# Patient Record
Sex: Male | Born: 1985 | Race: White | Hispanic: No | Marital: Married | Smoking: Never smoker
Health system: Southern US, Community
[De-identification: ages and names within clinical notes are randomized; demographics above are authoritative.]

## PROBLEM LIST (undated history)

## (undated) DIAGNOSIS — F419 Anxiety disorder, unspecified: Secondary | ICD-10-CM

## (undated) DIAGNOSIS — B019 Varicella without complication: Secondary | ICD-10-CM

## (undated) DIAGNOSIS — Z800 Family history of malignant neoplasm of digestive organs: Secondary | ICD-10-CM

## (undated) DIAGNOSIS — L409 Psoriasis, unspecified: Secondary | ICD-10-CM

## (undated) DIAGNOSIS — Z803 Family history of malignant neoplasm of breast: Secondary | ICD-10-CM

## (undated) DIAGNOSIS — G4700 Insomnia, unspecified: Secondary | ICD-10-CM

## (undated) DIAGNOSIS — Z801 Family history of malignant neoplasm of trachea, bronchus and lung: Secondary | ICD-10-CM

## (undated) DIAGNOSIS — M549 Dorsalgia, unspecified: Secondary | ICD-10-CM

## (undated) DIAGNOSIS — K589 Irritable bowel syndrome without diarrhea: Secondary | ICD-10-CM

## (undated) DIAGNOSIS — R011 Cardiac murmur, unspecified: Secondary | ICD-10-CM

## (undated) DIAGNOSIS — M1990 Unspecified osteoarthritis, unspecified site: Secondary | ICD-10-CM

## (undated) HISTORY — DX: Family history of malignant neoplasm of trachea, bronchus and lung: Z80.1

## (undated) HISTORY — DX: Psoriasis, unspecified: L40.9

## (undated) HISTORY — PX: COLONOSCOPY: SHX174

## (undated) HISTORY — DX: Insomnia, unspecified: G47.00

## (undated) HISTORY — DX: Family history of malignant neoplasm of digestive organs: Z80.0

## (undated) HISTORY — DX: Dorsalgia, unspecified: M54.9

## (undated) HISTORY — DX: Anxiety disorder, unspecified: F41.9

## (undated) HISTORY — DX: Family history of malignant neoplasm of breast: Z80.3

## (undated) HISTORY — PX: WISDOM TOOTH EXTRACTION: SHX21

## (undated) HISTORY — DX: Unspecified osteoarthritis, unspecified site: M19.90

## (undated) HISTORY — DX: Varicella without complication: B01.9

## (undated) HISTORY — DX: Cardiac murmur, unspecified: R01.1

## (undated) HISTORY — DX: Irritable bowel syndrome without diarrhea: K58.9

---

## 2015-05-04 ENCOUNTER — Ambulatory Visit (INDEPENDENT_AMBULATORY_CARE_PROVIDER_SITE_OTHER): Payer: BLUE CROSS/BLUE SHIELD | Admitting: Family Medicine

## 2015-05-04 ENCOUNTER — Encounter: Payer: Self-pay | Admitting: Family Medicine

## 2015-05-04 VITALS — BP 128/72 | HR 72 | Temp 98.0°F | Resp 16 | Ht 75.0 in | Wt 204.0 lb

## 2015-05-04 DIAGNOSIS — L409 Psoriasis, unspecified: Secondary | ICD-10-CM

## 2015-05-04 DIAGNOSIS — Z0000 Encounter for general adult medical examination without abnormal findings: Secondary | ICD-10-CM

## 2015-05-04 DIAGNOSIS — M1712 Unilateral primary osteoarthritis, left knee: Secondary | ICD-10-CM

## 2015-05-04 DIAGNOSIS — M129 Arthropathy, unspecified: Secondary | ICD-10-CM

## 2015-05-04 DIAGNOSIS — R222 Localized swelling, mass and lump, trunk: Secondary | ICD-10-CM

## 2015-05-04 DIAGNOSIS — Z114 Encounter for screening for human immunodeficiency virus [HIV]: Secondary | ICD-10-CM

## 2015-05-04 DIAGNOSIS — R229 Localized swelling, mass and lump, unspecified: Secondary | ICD-10-CM | POA: Diagnosis not present

## 2015-05-04 DIAGNOSIS — B353 Tinea pedis: Secondary | ICD-10-CM | POA: Diagnosis not present

## 2015-05-04 MED ORDER — CLOTRIMAZOLE 1 % EX CREA: 1.0000 "application " | TOPICAL_CREAM | Freq: Two times a day (BID) | CUTANEOUS | Status: DC

## 2015-05-04 MED ORDER — TRIAMCINOLONE ACETONIDE 0.1 % EX CREA: 1.0000 "application " | TOPICAL_CREAM | Freq: Two times a day (BID) | CUTANEOUS | Status: DC

## 2015-05-04 NOTE — Progress Notes (Addendum)
Subjective:    Patient ID: Paul [name], male    DOB: July 25, 1986, 29 y.o.   MRN: [id number]  HPI Patient presents for establishment of care  Arthritis left knee: Patient has a history of left knee trauma, and was told by orthopedics prior that he had moderate arthritis in that knee. He did not have to undergo knee surgery. Original trauma was when he was early 69s. He reports increased pain now that he is trying to jog daily, outside. He does not take any over-the-counter medications. He does not wear a knee brace or sleeve. He denies any erythema or swelling of the knee. He does not know of any family history of arthritis.  Scalp lesion: Patient reports he has noticed over the last 2 years he frequently has a scalp lesion, either over his left ear or near the front of his frontal hairline, that becomes scaly. Patient states he noticed a similar patch on his left upper arm, that eventually resolved on its own. Patient has not tried any over-the-counter medications or creams. He has tried medicated shampoos. He denies any erythema, drainage or pain associated with the lesion.  Bilateral feet blisters: Patient complains of occasional bilateral feet blisters, that are located on the heels and arch of his feet, that appear to occur more after running or working out. Patient describes blisterlike formations, that he pops and they feel better. He states he's had athletes foot in the past, but this looks different than what he is used to.  Mass on back: Patient has had a mass on his back for multiple years, without growth or tenderness. Patient states he's never had it examined, but his fiance has also noticed it and has concerns. There is no redness, swelling, pain or drainage associated with the mass.   Health maintenance:  Colonoscopy: No family history of colon cancer, routine screening at age 29 PSA: No family history of prostate cancer, routine screening  Immunizations: Tetanus up-to-date, flu  vaccination due Infectious disease screening: HIV screen needed   Non smoker Past Medical History  Diagnosis Date  . Arthritis   . Heart murmur     present since childhood, normal echo (per pt)  No Known Allergies  History reviewed. No pertinent past surgical history. Family History  Problem Relation Age of Onset  . Alcohol abuse Mother   . Lung cancer Father 95    nonsmoker  . Breast cancer Maternal Grandmother 61   Social History   Social History  . Marital Status: Single    Spouse Name: N/A  . Number of Children: N/A  . Years of Education: N/A   Occupational History  . Not on file.   Social History Main Topics  . Smoking status: Never Smoker   . Smokeless tobacco: Never Used  . Alcohol Use: Yes     Comment: 3x a week.   . Drug Use: No  . Sexual Activity: Yes    Birth Control/ Protection: None   Other Topics Concern  . Not on file   Social History Narrative   Recently moved from [place]. Engaged to [employer]. Wedding Date 01/2016. 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 1 dog. Wears his seatbelt. Wears a bike helmet. Exercises more than 3 times a week. Has a smoke alarm the home. Has firearms in the home.     Review of Systems  Constitutional: Negative.  Negative for activity change, appetite change, fatigue and unexpected weight change.  HENT: Negative.  Negative for congestion, hearing loss, sinus pressure, sneezing, tinnitus, trouble swallowing and voice change.   Eyes: Negative.  Negative for pain and visual disturbance.  Respiratory: Negative.  Negative for shortness of breath and wheezing.   Cardiovascular: Negative.  Negative for chest pain, palpitations and leg swelling.  Gastrointestinal: Negative.  Negative for nausea, abdominal pain, diarrhea, constipation and abdominal distention.  Endocrine: Negative.  Negative for polyuria.  Genitourinary: Negative.  Negative for urgency, hematuria, decreased urine volume and difficulty  urinating.  Musculoskeletal: Positive for arthralgias. Negative for myalgias, back pain, joint swelling, gait problem and neck pain.  Skin: Positive for rash.  Allergic/Immunologic: Negative.   Neurological: Negative for dizziness, tremors, syncope, weakness, numbness and headaches.  Hematological: Negative.   Psychiatric/Behavioral: Negative for sleep disturbance, decreased concentration and agitation. The patient is not nervous/anxious.        Objective:   Physical Exam BP 128/72 mmHg  Pulse 72  Temp(Src) 98 F (36.7 C) (Oral)  Resp 16  Ht  (1.905 m)  Wt 204 lb (92.534 kg)  BMI 25.50 kg/m2  SpO2 97% Gen: Afebrile. No acute distress. Nontoxic in appearance, well-developed, well-nourished, physically fit, athletic male. HENT: AT. [place]. Bilateral TM visualized and normal in appearance. MMM. Bilateral nares without erythema or swelling. Throat without erythema or exudates.  Eyes:Pupils Equal Round Reactive to light, Extraocular movements intact, Conjunctiva without redness, discharge or icterus. Neck: Supple, no lymphadenopathy, no thyromegaly CV: RRR 1/6 systolic murmur present, no edema, +2/4 P posterior tibialis pulses Chest: CTAB, no wheeze or crackles Abd: Soft. Flat. NTND. BS present. No Masses palpated. No hepatosplenomegaly Skin: No purpura or petechiae. Approximately 1.5 inch scaly patch lesion posterior auricular scalp. Left posterior scapula soft tissue mass, soft, nontender, no erythema, no drainage. Mobile. Neuro: Normal gait. PERLA. EOMi. Alert. Oriented. Cranial nerves II through XII intact. Muscle strength 5/5 upper and lower extremity. DTRs equal bilaterally. Psych: Normal affect, dress and demeanor. Normal speech. Normal thought content and judgment.    Assessment & Plan:  [name] is a 29 y.o. Caucasian male establishing care. Left knee arthritis: Patient with a history of left knee arthritis, his recently noticed acute changes with jogging. Discussed with  patient he may need to obtain a knee sleeve from one of the sports/medical supply companies. Advised him that if he wants to continue running he should at least wear one of these. Encouraged him to not overuse his left knee joint, with his history of arthritis after trauma, he may need to perform exercises with less impact. Even running indoors on a treadmill, would ease some pressure on his joints. New shoes that are well supported. Consider lower impact sports.   Mass on back: Patient with lipoma-like mass below left scapula. Mobile, nontender soft. Discussed we would watch and wait, if it becomes tender, starts to grow, or becomes more concerning, then we could send him to surgery for evaluation, and possible removal. Patient agrees with plan, and which is like to wait at this time.  Psoriasis of scalp: Psoriasis-like patches on scalp. Discussed with patient trial of Kenalog cream. He may eventually need to try foam since it is in his hairline. We'll start with cream first, and gauge response. - Patient will follow-up as needed.  Tinea pedis: Patient with blisterlike formations that occur intermittently on the bottom of his feet after working out. Right foot with scaly, blisterlike formation. Likely fungal considering history and nature. Prescribed Lotrimin anti-fungal, patient to follow-up if no improvement. Discussed that if  it improves with his, that would suggest it is fungal. Discussed keeping area clean and dry, wearing absorbent socks.   Health maintenance:  Colonoscopy: No family history of colon cancer, routine screening at age 56 PSA: No family history of prostate cancer, routine screening  Immunizations: Tetanus up-to-date, flu vaccination due patient is to make a nurse visit to have completed within the next 2 weeks, if he does not obtain at his place of employment. Infectious disease screening: HIV screen future ordered. CBC also ordered.   > 25 minutes spent with patient, >50% of  time spent face to face counseling patient and coordinating care.

## 2015-05-04 NOTE — Patient Instructions (Signed)

## 2015-05-04 NOTE — Addendum Note (Signed)
Addended by: Felix Pacini A on: 05/04/2015 06:25 PM   Modules accepted: Orders

## 2015-05-04 NOTE — Addendum Note (Signed)
Addended by: Felix Pacini A on: 05/04/2015 06:24 PM   Modules accepted: Orders

## 2015-05-04 NOTE — Progress Notes (Signed)
Pre visit review using our clinic review tool, if applicable. No additional management support is needed unless otherwise documented below in the visit note. 

## 2015-05-25 ENCOUNTER — Encounter: Payer: Self-pay | Admitting: Family Medicine

## 2015-07-05 ENCOUNTER — Ambulatory Visit (INDEPENDENT_AMBULATORY_CARE_PROVIDER_SITE_OTHER): Payer: BLUE CROSS/BLUE SHIELD | Admitting: Family Medicine

## 2015-07-05 ENCOUNTER — Encounter: Payer: Self-pay | Admitting: Family Medicine

## 2015-07-05 VITALS — BP 128/71 | HR 68 | Temp 98.4°F | Resp 18 | Wt 205.8 lb

## 2015-07-05 DIAGNOSIS — F411 Generalized anxiety disorder: Secondary | ICD-10-CM | POA: Diagnosis not present

## 2015-07-05 MED ORDER — LORAZEPAM 0.5 MG PO TABS: 0.5000 mg | ORAL_TABLET | Freq: Two times a day (BID) | ORAL | Status: DC | PRN

## 2015-07-05 MED ORDER — ESCITALOPRAM OXALATE 10 MG PO TABS: 10.0000 mg | ORAL_TABLET | Freq: Every day | ORAL | Status: DC

## 2015-07-05 NOTE — Progress Notes (Signed)
Subjective:    Patient ID: Paul [name], male    DOB: 1985/09/18, 29 y.o.   MRN: [id number]  HPI  Elevated blood pressure/anxiety related: Patient presents for office visit today after having 2 elevated blood pressure readings at health fair at work. Patient states that he had his blood pressure taking in the morning and in the afternoon at work on 2 different days if his blood pressure was high on both occasions. He states the highest blood pressure reading is high was 158/86. His blood pressure is normal today in the office, and consistent with prior blood pressure readings taken in this office. Patient reports that he has been more stressed. 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 He states managing people is causing him to be more stressed in the work environment. He does feel like he is taking his work home, and he is been unable to fall asleep at night. He states that he doesn't feel like he had a good night sleep in quite some time. He admits to being stressed on the weekends, especially Sunday when he realizes he has to return to work on Monday.  Past Medical History  Diagnosis Date  . Arthritis   . Heart murmur     present since childhood, normal echo 2002  . Back pain     03/17/12 xray: minimal compression deformity L1. 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  . Tinea versicolor   . Chickenpox     childhood    No Known Allergies Social History   Social History  . Marital Status: Single    Spouse Name: N/A  . Number of Children: N/A  . Years of Education: N/A   Occupational History  . Not on file.   Social History Main Topics  . Smoking status: Never Smoker   . Smokeless tobacco: Never Used  . Alcohol Use: Yes     Comment: 3x a week.   . Drug Use: No  . Sexual Activity: Yes    Birth Control/ Protection: None   Other Topics Concern  . Not on file   Social History Narrative   Recently moved from [place].  Engaged to [employer]. Wedding Date 01/2016. 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 1 dog. Wears his seatbelt. Wears a bike helmet. Exercises more than 3 times a week. Has a smoke alarm the home. Has firearms in the home.     Review of Systems Negative, with the exception of above mentioned in HPI     Objective:   Physical Exam BP 128/71 mmHg  Pulse 68  Temp(Src) 98.4 F (36.9 C) (Oral)  Resp 18  Wt 205 lb 12 oz (93.328 kg)  SpO2 99% Gen: Afebrile. No acute distress. Nontoxic in appearance, well-developed, well-nourished Caucasian male. HENT: AT. [place].  MMM.  Eyes:Pupils Equal Round Reactive to light, Extraocular movements intact,  Conjunctiva without redness, discharge or icterus. CV: RRR Psych: Anxious, psychomotor agitation present, Normal dress. Normal speech. Normal thought content and judgment.Marland Kitchen    GAD 7 : Generalized Anxiety Score 07/05/2015  Nervous, Anxious, on Edge 2  Control/stop worrying 2  Worry too much - different things 2  Trouble relaxing 3  Restless 3  Easily annoyed or irritable 2  Afraid - awful might happen 0  Total GAD 7 Score 14  Anxiety Difficulty Somewhat difficult       Assessment & Plan:  1. Anxiety state - New-onset  anxiety, seems to be surrounding his work environment and new job responsibilities. GAD 14. - LORazepam (ATIVAN) 0.5 MG tablet; Take 1 tablet (0.5 mg total) by mouth 2 (two) times daily as needed for anxiety.  Dispense: 60 tablet; Refill: 1 - escitalopram (LEXAPRO) 10 MG tablet; Take 1 tablet (10 mg total) by mouth daily.  Dispense: 30 tablet; Refill: 0 - Discussed short-term use of Ativan, and use prior to bed. Start Lexapro 10 mg daily, follow-up in 4 weeks. At that time we will discuss if patient needs higher dose. If doing well we'll follow-up every 3 months.

## 2015-07-05 NOTE — Patient Instructions (Signed)
Generalized Anxiety Disorder Generalized anxiety disorder (GAD) is a mental disorder. It interferes with life functions, including relationships, work, and school. GAD is different from normal anxiety, which everyone experiences at some point in their lives in response to specific life events and activities. Normal anxiety actually helps us prepare for and get through these life events and activities. Normal anxiety goes away after the event or activity is over.  GAD causes anxiety that is not necessarily related to specific events or activities. It also causes excess anxiety in proportion to specific events or activities. The anxiety associated with GAD is also difficult to control. GAD can vary from mild to severe. People with severe GAD can have intense waves of anxiety with physical symptoms (panic attacks).  SYMPTOMS The anxiety and worry associated with GAD are difficult to control. This anxiety and worry are related to many life events and activities and also occur more days than not for 6 months or longer. People with GAD also have three or more of the following symptoms (one or more in children):  Restlessness.   Fatigue.  Difficulty concentrating.   Irritability.  Muscle tension.  Difficulty sleeping or unsatisfying sleep. DIAGNOSIS GAD is diagnosed through an assessment by your health care provider. Your health care provider will ask you questions aboutyour mood,physical symptoms, and events in your life. Your health care provider may ask you about your medical history and use of alcohol or drugs, including prescription medicines. Your health care provider may also do a physical exam and blood tests. Certain medical conditions and the use of certain substances can cause symptoms similar to those associated with GAD. Your health care provider may refer you to a mental health specialist for further evaluation. TREATMENT The following therapies are usually used to treat GAD:    Medication. Antidepressant medication usually is prescribed for long-term daily control. Antianxiety medicines may be added in severe cases, especially when panic attacks occur.   Talk therapy (psychotherapy). Certain types of talk therapy can be helpful in treating GAD by providing support, education, and guidance. A form of talk therapy called cognitive behavioral therapy can teach you healthy ways to think about and react to daily life events and activities.  Stress managementtechniques. These include yoga, meditation, and exercise and can be very helpful when they are practiced regularly. A mental health specialist can help determine which treatment is best for you. Some people see improvement with one therapy. However, other people require a combination of therapies.   This information is not intended to replace advice given to you by your health care provider. Make sure you discuss any questions you have with your health care provider.   Document Released: 12/23/2012 Document Revised: 09/18/2014 Document Reviewed: 12/23/2012 Elsevier Interactive Patient Education [employer]2016 Elsevier Inc.  Your anxiety is likely situational, surrounding work/life changes.   I will need to see you in 4 weeks to discuss meds and increase dose if needed at that time.

## 2015-07-09 ENCOUNTER — Encounter: Payer: Self-pay | Admitting: *Deleted

## 2015-08-02 ENCOUNTER — Ambulatory Visit (INDEPENDENT_AMBULATORY_CARE_PROVIDER_SITE_OTHER): Payer: BLUE CROSS/BLUE SHIELD | Admitting: Family Medicine

## 2015-08-02 ENCOUNTER — Encounter: Payer: Self-pay | Admitting: Family Medicine

## 2015-08-02 VITALS — BP 127/74 | HR 81 | Temp 98.6°F | Resp 20 | Wt 206.0 lb

## 2015-08-02 DIAGNOSIS — Z8719 Personal history of other diseases of the digestive system: Secondary | ICD-10-CM | POA: Insufficient documentation

## 2015-08-02 DIAGNOSIS — F411 Generalized anxiety disorder: Secondary | ICD-10-CM

## 2015-08-02 DIAGNOSIS — G4700 Insomnia, unspecified: Secondary | ICD-10-CM | POA: Insufficient documentation

## 2015-08-02 DIAGNOSIS — M549 Dorsalgia, unspecified: Secondary | ICD-10-CM | POA: Insufficient documentation

## 2015-08-02 DIAGNOSIS — M546 Pain in thoracic spine: Secondary | ICD-10-CM

## 2015-08-02 MED ORDER — TRAZODONE HCL 50 MG PO TABS: 25.0000 mg | ORAL_TABLET | Freq: Every evening | ORAL | Status: DC | PRN

## 2015-08-02 MED ORDER — HYDROCORTISONE ACETATE 25 MG RE SUPP: 25.0000 mg | Freq: Two times a day (BID) | RECTAL | Status: DC

## 2015-08-02 MED ORDER — ESCITALOPRAM OXALATE 20 MG PO TABS: 20.0000 mg | ORAL_TABLET | Freq: Every day | ORAL | Status: DC

## 2015-08-02 NOTE — Patient Instructions (Signed)
High-Fiber Diet Fiber, also called dietary fiber, is a type of carbohydrate found in fruits, vegetables, whole grains, and beans. A high-fiber diet can have many health benefits. Your health care provider may recommend a high-fiber diet to help:  Prevent constipation. Fiber can make your bowel movements more regular.  Lower your cholesterol.  Relieve hemorrhoids, uncomplicated diverticulosis, or irritable bowel syndrome.  Prevent overeating as part of a weight-loss plan.  Prevent heart disease, type 2 diabetes, and certain cancers. WHAT IS MY PLAN? The recommended daily intake of fiber includes:  38 grams for men under age 27.  97 grams for men over age 77.  70 grams for women under age 31.  7 grams for women over age 62. You can get the recommended daily intake of dietary fiber by eating a variety of fruits, vegetables, grains, and beans. Your health care provider may also recommend a fiber supplement if it is not possible to get enough fiber through your diet. WHAT DO I NEED TO KNOW ABOUT A HIGH-FIBER DIET?  Fiber supplements have not been widely studied for their effectiveness, so it is better to get fiber through food sources.  Always check the fiber content on thenutrition facts label of any prepackaged food. Look for foods that contain at least 5 grams of fiber per serving.  Ask your dietitian if you have questions about specific foods that are related to your condition, especially if those foods are not listed in the following section.  Increase your daily fiber consumption gradually. Increasing your intake of dietary fiber too quickly may cause bloating, cramping, or gas.  Drink plenty of water. Water helps you to digest fiber. WHAT FOODS CAN I EAT? Grains Whole-grain breads. Multigrain cereal. Oats and oatmeal. Brown rice. Barley. Bulgur wheat. [place]. Bran muffins. Popcorn. Rye wafer crackers. Vegetables Sweet potatoes. Spinach. Kale. Artichokes. Cabbage. Broccoli.  Green peas. Carrots. Squash. Fruits Berries. Pears. Apples. Oranges. Avocados. Prunes and raisins. Dried figs. Meats and Other Protein Sources Navy, kidney, pinto, and soy beans. Split peas. Lentils. Nuts and seeds. Dairy Fiber-fortified yogurt. Beverages Fiber-fortified soy milk. Fiber-fortified orange juice. Other Fiber bars. The items listed above may not be a complete list of recommended foods or beverages. Contact your dietitian for more options. WHAT FOODS ARE NOT RECOMMENDED? Grains White bread. Pasta made with refined flour. White rice. Vegetables Fried potatoes. Canned vegetables. Well-cooked vegetables.  Fruits Fruit juice. Cooked, strained fruit. Meats and Other Protein Sources Fatty cuts of meat. Fried Sales executive or fried fish. Dairy Milk. Yogurt. Cream cheese. Sour cream. Beverages Soft drinks. Other Cakes and pastries. Butter and oils. The items listed above may not be a complete list of foods and beverages to avoid. Contact your dietitian for more information. WHAT ARE SOME TIPS FOR INCLUDING HIGH-FIBER FOODS IN MY DIET?  Eat a wide variety of high-fiber foods.  Make sure that half of all grains consumed each day are whole grains.  Replace breads and cereals made from refined flour or white flour with whole-grain breads and cereals.  Replace white rice with brown rice, bulgur wheat, or millet.  Start the day with a breakfast that is high in fiber, such as a cereal that contains at least 5 grams of fiber per serving.  Use beans in place of meat in soups, salads, or pasta.  Eat high-fiber snacks, such as berries, raw vegetables, nuts, or popcorn.   This information is not intended to replace advice given to you by your health care provider. Make sure you discuss  any questions you have with your health care provider.   Document Released: 08/28/2005 Document Revised: 09/18/2014 Document Reviewed: 02/10/2014 Elsevier Interactive Patient Education 2016 Elsevier  Inc.    35 grams goal. Trazodone 1-2 tabs prior to bed. Try not to use ativan with trazodone if able. Take about 1 hour before bed.  I have increased your dose of lexapro to 20 mg daily.

## 2015-08-02 NOTE — Progress Notes (Signed)
Subjective:    Patient ID: Paul [name], male    DOB: [birth year], 30 y.o.   MRN: [id number]  HPI  Anxiety: pt states he is feeling better with his anxiety. He thinks he could use the increased dose of the lexapro. He is taking the ativan prior to bed and it is helping him fall asleep, but he still awakens around 2-3 am. 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 He feels tired upon awaking. He reports good sleep hygiene.  History of Bloody stools: Pt has had bloody stools for the majority of his life. He does not recall being told he had hemorrhoids in the past. He notices they are worse when he used to lift weights. He states he frequently sees a small amount a perforated blood on his toilet paper after stools, not daily. He does not feel the blood is associated with it or straining. He states he is not frequently constipated or straining. He does notice with spicy foods history this can be uncomfortable. Otherwise he does not have painful defecation even with blood presentation. Recent lab work for employer last week has a normal hemoglobin and hematocrit. Patient denies any fevers. He has never tried any medications for hemorrhoids. He is currently not having bloody stools.  back pain: Patient states he has had new onset back pain, points to his mid thoracic region and states it was painful to touch the last few days. No injury, or change in physical activity. He denied any swelling or fever.   Patient brings in copies of lab work collected at work 07/20/2015: CMP: Glucose 98, uric acid 7, BUN 14, creatinine 1.01, sodium 139, potassium 4.1, chloride 98, carbon dioxide 23, calcium 9.7, total protein 0.0, albumin 4.8, alkaline phosphatase 42, AST 20, ALT 16 Lipids: Cholesterol 187, HDL 59, LDL 77. Triglycerides 254 (not fasting) TSH 1.3 CBC: WBC 8.5, RBC 4.67, hemoglobin 13.8, hematocrit 40.7, MCV 87, plt  362  Never smoker  Past Medical History  Diagnosis Date  . Arthritis   . Heart murmur     present since childhood, normal echo 2002  . Back pain     03/17/12 xray: minimal compression deformity L1. 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  . Tinea versicolor   . Chickenpox     childhood    No Known Allergies  Review of Systems Negative, with the exception of above mentioned in HPI     Objective:   Physical Exam BP 127/74 mmHg  Pulse 81  Temp(Src) 98.6 F (37 C) (Oral)  Resp 20  Wt 206 lb (93.441 kg)  SpO2 97% Gen: Afebrile. No acute distress. No acute distress, well-developed, well-nourished, Caucasian male. Pleasant. CV: RRR Chest: CTAB, no wheeze or crackles MSK: No erythema, no skin changes, no TTP thoracic spine, no step off, mild muscle fullness left paraspinal T2-8. Neurovascularly intact.  Psych: Normal affect, dress and demeanor. Normal speech. Normal thought content and judgment.     Assessment & Plan:  1. Anxiety state - Improved, patient is doing better but could use higher dose. (20 mg called in today. Patient follow-up in 3 months. - escitalopram (LEXAPRO) 20 MG tablet; Take 1 tablet (20 mg total) by mouth daily.  Dispense: 90 tablet; Refill: 0  2. Insomnia - Discontinue Ativan, use trazodone 1 hour before bedtime. - traZODone (DESYREL) 50 MG tablet; Take 0.5-1 tablets (25-50 mg total) by mouth at bedtime as  needed for sleep.  Dispense: 30 tablet; Refill: 3 - Follow-up 3 months  3. History of bloody stools - Patient currently not experiencing bloody stools. Discussed with him the use of Anusol suppositories if he notes bloody stools again he is to start use of suppositories, and be seen at that time. Likely bleeding coming from internal hemorrhoid, considering there is no pain associated with the bleeding. Recent hemoglobin reassuring. Hemoccult cards at home with patient to complete. - Hemoccult Cards (X3 cards); Future -  hydrocortisone (ANUSOL-HC) 25 MG suppository; Place 1 suppository (25 mg total) rectally 2 (two) times daily.  Dispense: 12 suppository; Refill: 0 - Consider GI referral if Hemoccult positive. - Follow-up as needed.  4. Midline thoracic back pain - Unknown etiology of pain, cannot reproduce pain today on exam. Discussed with patient to make an appointment for his back pain returns. Discussed likely etiology of her overuse versus posture.  Follow-up 3 months, sooner if placed dose or back pain returns.

## 2015-09-12 DIAGNOSIS — K589 Irritable bowel syndrome without diarrhea: Secondary | ICD-10-CM

## 2015-09-12 DIAGNOSIS — G4700 Insomnia, unspecified: Secondary | ICD-10-CM

## 2015-09-12 HISTORY — DX: Insomnia, unspecified: G47.00

## 2015-09-12 HISTORY — DX: Irritable bowel syndrome, unspecified: K58.9

## 2015-10-13 ENCOUNTER — Ambulatory Visit (INDEPENDENT_AMBULATORY_CARE_PROVIDER_SITE_OTHER): Payer: BLUE CROSS/BLUE SHIELD | Admitting: Family Medicine

## 2015-10-13 ENCOUNTER — Encounter: Payer: Self-pay | Admitting: Family Medicine

## 2015-10-13 VITALS — BP 125/82 | HR 100 | Temp 98.4°F | Resp 20 | Wt 208.0 lb

## 2015-10-13 DIAGNOSIS — F411 Generalized anxiety disorder: Secondary | ICD-10-CM | POA: Diagnosis not present

## 2015-10-13 DIAGNOSIS — G4700 Insomnia, unspecified: Secondary | ICD-10-CM | POA: Diagnosis not present

## 2015-10-13 MED ORDER — TRAZODONE HCL 100 MG PO TABS: 100.0000 mg | ORAL_TABLET | Freq: Every evening | ORAL | Status: DC | PRN

## 2015-10-13 NOTE — Progress Notes (Signed)
Patient ID: Paul [name], male   DOB: [birth year], 30 y.o.   MRN: [id number] .rkac   Subjective:    Patient ID: Paul [name], male    DOB: 05-Nov-1985, 9 y.o.   MRN: [id number]  HPI  Insomnia/anxiety: Patient reports he has not taken the Lexapro and approximately a week. He doesn't feel it is effective any longer. He has increased his daily workouts, and feels like this is making him feel better. 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 Patient is getting married in May. He states he is able to get good rest at night. He reports he goes to sleep around 10 PM every night, does not watch the TV, does not snore, and does not have restless leg. He follows asleep quickly, without difficulty. He finds himself waking up it 2-3 AM every evening, with difficulty falling back asleep. He states sometimes he cannot get back to sleep, like this morning, he eventually cut out a bag ready for work and went to work early, leaving the house at [employer] AM for work. He states when he cannot get back to sleep, he is just sitting there thinking of things, but he does not think this is related to anxiety. He has never had sleep disturbances prior to the last few months. He has been taking 50 mg of trazodone daily at bedtime, however this is not lasting him through the night.    Past Medical History  Diagnosis Date  . Arthritis   . Heart murmur     present since childhood, normal echo 2002  . Back pain     03/17/12 xray: minimal compression deformity L1. 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  . Tinea versicolor   . Chickenpox     childhood    No Known Allergies Social History   Social History  . Marital Status: Single    Spouse Name: N/A  . Number of Children: N/A  . Years of Education: N/A   Occupational History  . Not on file.   Social History Main Topics  . Smoking status: Never  Smoker   . Smokeless tobacco: Never Used  . Alcohol Use: Yes     Comment: 3x a week.   . Drug Use: No  . Sexual Activity: Yes    Birth Control/ Protection: None   Other Topics Concern  . Not on file   Social History Narrative   Recently moved from [place]. Engaged to [employer]. Wedding Date 01/2016. 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 1 dog. Wears his seatbelt. Wears a bike helmet. Exercises more than 3 times a week. Has a smoke alarm the home. Has firearms in the home.     Review of Systems Negative, with the exception of above mentioned in HPI     Objective:   Physical Exam BP 125/82 mmHg  Pulse 100  Temp(Src) 98.4 F (36.9 C)  Resp 20  Wt 208 lb (94.348 kg)  SpO2 97% Gen: Afebrile. No acute distress. Nontoxic in appearance, well-developed, well-nourished Caucasian male. HENT: AT. [place].  MMM.  Eyes:Pupils Equal Round Reactive to light, Extraocular movements intact,  Conjunctiva without redness, discharge or icterus. CV: RRR Psych: Normal dress. Normal affect Normal speech. Normal thought content and judgment.    GAD 7 : Generalized Anxiety Score 07/05/2015  Nervous, Anxious, on Edge 2  Control/stop worrying 2  Worry too much -  different things 2  Trouble relaxing 3  Restless 3  Easily annoyed or irritable 2  Afraid - awful might happen 0  Total GAD 7 Score 14  Anxiety Difficulty Somewhat difficult       Assessment & Plan:  1. Insomnia: - not controlled, worsening.  - Increased trazodone to 100 mg QD, pt can increase to 200 mg in 2-3 days if not effective. Will need to call in more #if draining 200 mg nightly. - If not working at higher dose will need to consider Ambien or other agent.  - good sleep hygiene. Does not snore. Does not seem to be restless leg.  - f/U 3-4 weeks if not controlled.   2. Anxiety: Patient feels his anxiety is controlled without Lexapro and with exercise. He is now transitioning into a new role at work, that is  less stressful. Discontinue Lexapro.  > 25 minutes spent with patient, >50% of time spent face to face counseling patient and coordinating care.

## 2015-10-13 NOTE — Patient Instructions (Signed)
Insomnia Insomnia is a sleep disorder that makes it difficult to fall asleep or to stay asleep. Insomnia can cause tiredness (fatigue), low energy, difficulty concentrating, mood swings, and poor performance at work or school.  There are three different ways to classify insomnia:  Difficulty falling asleep.  Difficulty staying asleep.  Waking up too early in the morning. Any type of insomnia can be long-term (chronic) or short-term (acute). Both are common. Short-term insomnia usually lasts for three months or less. Chronic insomnia occurs at least three times a week for longer than three months. CAUSES  Insomnia may be caused by another condition, situation, or substance, such as:  Anxiety.  Certain medicines.  Gastroesophageal reflux disease (GERD) or other gastrointestinal conditions.  Asthma or other breathing conditions.  Restless legs syndrome, sleep apnea, or other sleep disorders.  Chronic pain.  Menopause. This may include hot flashes.  Stroke.  Abuse of alcohol, tobacco, or illegal drugs.  Depression.  Caffeine.   Neurological disorders, such as Alzheimer disease.  An overactive thyroid (hyperthyroidism). The cause of insomnia may not be known. RISK FACTORS Risk factors for insomnia include:  Gender. Women are more commonly affected than men.  Age. Insomnia is more common as you get older.  Stress. This may involve your professional or personal life.  Income. Insomnia is more common in people with lower income.  Lack of exercise.   Irregular work schedule or night shifts.  Traveling between different time zones. SIGNS AND SYMPTOMS If you have insomnia, trouble falling asleep or trouble staying asleep is the main symptom. This may lead to other symptoms, such as:  Feeling fatigued.  Feeling nervous about going to sleep.  Not feeling rested in the morning.  Having trouble concentrating.  Feeling irritable, anxious, or depressed. TREATMENT   Treatment for insomnia depends on the cause. If your insomnia is caused by an underlying condition, treatment will focus on addressing the condition. Treatment may also include:   Medicines to help you sleep.  Counseling or therapy.  Lifestyle adjustments. HOME CARE INSTRUCTIONS   Take medicines only as directed by your health care provider.  Keep regular sleeping and waking hours. Avoid naps.  Keep a sleep diary to help you and your health care provider figure out what could be causing your insomnia. Include:   When you sleep.  When you wake up during the night.  How well you sleep.   How rested you feel the next day.  Any side effects of medicines you are taking.  What you eat and drink.   Make your bedroom a comfortable place where it is easy to fall asleep:  Put up shades or special blackout curtains to block light from outside.  Use a white noise machine to block noise.  Keep the temperature cool.   Exercise regularly as directed by your health care provider. Avoid exercising right before bedtime.  Use relaxation techniques to manage stress. Ask your health care provider to suggest some techniques that may work well for you. These may include:  Breathing exercises.  Routines to release muscle tension.  Visualizing peaceful scenes.  Cut back on alcohol, caffeinated beverages, and cigarettes, especially close to bedtime. These can disrupt your sleep.  Do not overeat or eat spicy foods right before bedtime. This can lead to digestive discomfort that can make it hard for you to sleep.  Limit screen use before bedtime. This includes:  Watching TV.  Using your smartphone, tablet, and computer.  Stick to a routine. This   can help you fall asleep faster. Try to do a quiet activity, brush your teeth, and go to bed at the same time each night.  Get out of bed if you are still awake after 15 minutes of trying to sleep. Keep the lights down, but try reading or  doing a quiet activity. When you feel sleepy, go back to bed.  Make sure that you drive carefully. Avoid driving if you feel very sleepy.  Keep all follow-up appointments as directed by your health care provider. This is important. SEEK MEDICAL CARE IF:   You are tired throughout the day or have trouble in your daily routine due to sleepiness.  You continue to have sleep problems or your sleep problems get worse. SEEK IMMEDIATE MEDICAL CARE IF:   You have serious thoughts about hurting yourself or someone else.   This information is not intended to replace advice given to you by your health care provider. Make sure you discuss any questions you have with your health care provider.   Document Released: 08/25/2000 Document Revised: 05/19/2015 Document Reviewed: 05/29/2014 Elsevier Interactive Patient Education 2016 [employer].  Trazodone now 100 mg (1 pill of new script) before bed. If not working well enough you can increase to 200 mg before bed.  You will need to be seen if this does is not working for you. If it is working you will need to call in so I can change your prescription so you have enough medications at that dose.

## 2015-11-03 ENCOUNTER — Ambulatory Visit: Payer: BLUE CROSS/BLUE SHIELD | Admitting: Family Medicine

## 2016-02-10 DIAGNOSIS — M5136 Other intervertebral disc degeneration, lumbar region: Secondary | ICD-10-CM | POA: Diagnosis not present

## 2016-02-10 DIAGNOSIS — M9903 Segmental and somatic dysfunction of lumbar region: Secondary | ICD-10-CM | POA: Diagnosis not present

## 2016-02-10 DIAGNOSIS — M62830 Muscle spasm of back: Secondary | ICD-10-CM | POA: Diagnosis not present

## 2016-02-10 DIAGNOSIS — M545 Low back pain: Secondary | ICD-10-CM | POA: Diagnosis not present

## 2016-02-23 DIAGNOSIS — M5136 Other intervertebral disc degeneration, lumbar region: Secondary | ICD-10-CM | POA: Diagnosis not present

## 2016-02-23 DIAGNOSIS — M545 Low back pain: Secondary | ICD-10-CM | POA: Diagnosis not present

## 2016-02-23 DIAGNOSIS — M62830 Muscle spasm of back: Secondary | ICD-10-CM | POA: Diagnosis not present

## 2016-02-23 DIAGNOSIS — M9903 Segmental and somatic dysfunction of lumbar region: Secondary | ICD-10-CM | POA: Diagnosis not present

## 2016-02-24 DIAGNOSIS — M545 Low back pain: Secondary | ICD-10-CM | POA: Diagnosis not present

## 2016-02-24 DIAGNOSIS — M62830 Muscle spasm of back: Secondary | ICD-10-CM | POA: Diagnosis not present

## 2016-02-24 DIAGNOSIS — M5136 Other intervertebral disc degeneration, lumbar region: Secondary | ICD-10-CM | POA: Diagnosis not present

## 2016-02-24 DIAGNOSIS — M9903 Segmental and somatic dysfunction of lumbar region: Secondary | ICD-10-CM | POA: Diagnosis not present

## 2016-02-28 ENCOUNTER — Ambulatory Visit (INDEPENDENT_AMBULATORY_CARE_PROVIDER_SITE_OTHER): Payer: BLUE CROSS/BLUE SHIELD | Admitting: Family Medicine

## 2016-02-28 ENCOUNTER — Encounter: Payer: Self-pay | Admitting: Family Medicine

## 2016-02-28 VITALS — BP 124/80 | HR 71 | Temp 98.4°F | Resp 20 | Ht 76.0 in | Wt 209.0 lb

## 2016-02-28 DIAGNOSIS — R197 Diarrhea, unspecified: Secondary | ICD-10-CM | POA: Diagnosis not present

## 2016-02-28 DIAGNOSIS — Z8719 Personal history of other diseases of the digestive system: Secondary | ICD-10-CM

## 2016-02-28 LAB — CBC WITH DIFFERENTIAL/PLATELET
BASOS PCT: 0 %
Basophils Absolute: 0 cells/uL (ref 0–200)
EOS PCT: 1 %
Eosinophils Absolute: 60 cells/uL (ref 15–500)
HCT: 40.7 % (ref 38.5–50.0)
Hemoglobin: 13.7 g/dL (ref 13.2–17.1)
LYMPHS PCT: 33 %
Lymphs Abs: 1980 cells/uL (ref 850–3900)
MCH: 29.3 pg (ref 27.0–33.0)
MCHC: 33.7 g/dL (ref 32.0–36.0)
MCV: 87.2 fL (ref 80.0–100.0)
MPV: 8.8 fL (ref 7.5–12.5)
Monocytes Absolute: 480 cells/uL (ref 200–950)
Monocytes Relative: 8 %
NEUTROS PCT: 58 %
Neutro Abs: 3480 cells/uL (ref 1500–7800)
PLATELETS: 315 10*3/uL (ref 140–400)
RBC: 4.67 MIL/uL (ref 4.20–5.80)
RDW: 13.4 % (ref 11.0–15.0)
WBC: 6 10*3/uL (ref 3.8–10.8)

## 2016-02-28 LAB — LIPASE: LIPASE: 34 U/L (ref 7–60)

## 2016-02-28 LAB — COMPREHENSIVE METABOLIC PANEL
ALT: 20 U/L (ref 9–46)
AST: 19 U/L (ref 10–40)
Albumin: 4.7 g/dL (ref 3.6–5.1)
Alkaline Phosphatase: 31 U/L — ABNORMAL LOW (ref 40–115)
BILIRUBIN TOTAL: 0.4 mg/dL (ref 0.2–1.2)
BUN: 17 mg/dL (ref 7–25)
CALCIUM: 9.5 mg/dL (ref 8.6–10.3)
CO2: 27 mmol/L (ref 20–31)
CREATININE: 1.17 mg/dL (ref 0.60–1.35)
Chloride: 104 mmol/L (ref 98–110)
GLUCOSE: 89 mg/dL (ref 65–99)
Potassium: 4.2 mmol/L (ref 3.5–5.3)
SODIUM: 142 mmol/L (ref 135–146)
Total Protein: 7.1 g/dL (ref 6.1–8.1)

## 2016-02-28 LAB — TSH: TSH: 2.02 mIU/L (ref 0.40–4.50)

## 2016-02-28 LAB — C-REACTIVE PROTEIN: CRP: <0.5 mg/dL (ref <0.60)

## 2016-02-28 MED ORDER — DICYCLOMINE HCL 10 MG PO CAPS: 10.0000 mg | ORAL_CAPSULE | Freq: Three times a day (TID) | ORAL | Status: DC

## 2016-02-28 MED ORDER — DIPHENOXYLATE-ATROPINE 2.5-0.025 MG PO TABS: 1.0000 | ORAL_TABLET | Freq: Four times a day (QID) | ORAL | Status: DC | PRN

## 2016-02-28 NOTE — Progress Notes (Signed)
Patient ID: Nikolus Marczak, male   DOB: [birth year], 30 y.o.   MRN: [id number]    Sriram Febles , 03/13/1986, 30 y.o., male MRN: [id number]  CC: Diarrhea Subjective: Pt presents for an acute OV with complaints of Diarrhea of 1 month  Duration. He states every time he eats,  he has to go to bathroom with  watery diarrhea. He endorses mild cramping discomfort, but no abdominal pain. He endorses relief after BM. He denies night time symptoms, fever, chills, weight loss, change in diet, rash. Patient states he has never had a "great colon." He has never had symptoms as severe as he does currently. He can not associate it directly with a certain type of food, but states beer and fatty food might make it worse. He states he feels the symptoms started right after his return from [place] for his honeymoon. They stayed on an resort and did not eat any meals off the resort. His wife is not ill. He has had anxiety issues in the past, he currently is not on medications.  Associated symptoms include . Pt feels symptoms after his honeymoon in [place]. He denies fhx of IBD, IBS or colon cancer.     No Known Allergies Social History  Substance Use Topics  . Smoking status: Never Smoker   . Smokeless tobacco: Never Used  . Alcohol Use: Yes     Comment: 3x a week.    Past Medical History  Diagnosis Date  . Arthritis   . Heart murmur     present since childhood, normal echo 2002  . Back pain     03/17/12 xray: minimal compression deformity L1. 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  . Tinea versicolor   . Chickenpox     childhood    History reviewed. No pertinent past surgical history. Family History  Problem Relation Age of Onset  . Alcohol abuse Mother   . Lung cancer Father 13    nonsmoker  . Breast cancer Maternal Grandmother 40     Medication List       This list is accurate as of: 02/28/16  4:18 PM.  Always use your most recent med list.               clotrimazole 1 %  cream  Commonly known as:  LOTRIMIN  Apply 1 application topically 2 (two) times daily.     hydrocortisone 25 MG suppository  Commonly known as:  ANUSOL-HC  Place 1 suppository (25 mg total) rectally 2 (two) times daily.     traZODone 100 MG tablet  Commonly known as:  DESYREL  Take 1 tablet (100 mg total) by mouth at bedtime as needed for sleep.     triamcinolone cream 0.1 %  Commonly known as:  KENALOG  Apply 1 application topically 2 (two) times daily.         ROS: Negative, with the exception of above mentioned in HPI   Objective:  BP 124/80 mmHg  Pulse 71  Temp(Src) 98.4 F (36.9 C)  Resp 20  Ht '6\' 4"'  (1.93 m)  Wt 209 lb (94.802 kg)  BMI 25.45 kg/m2  SpO2 100% Body mass index is 25.45 kg/(m^2). Gen: Afebrile. No acute distress. Nontoxic in appearance, well developed, well nourished. Pleasant male.  HENT: AT. [place].MMM, no oral lesions.  Eyes:Pupils Equal Round Reactive to light, Extraocular movements intact,  Conjunctiva without redness, discharge or icterus. CV: RRR  Abd: Soft. Flat. NTND. BS present. no Masses palpated. No rebound  or guarding.  Skin: no rashes, purpura or petechiae.  Neuro: Normal gait. PERLA. EOMi. Alert. Oriented x3  Psych: Normal affect, dress and demeanor. Normal speech. Normal thought content and judgment..   Assessment/Plan: Geral Coker is a 30 y.o. male present for acute OV for  Diarrhea, unspecified type/h/o bloody stools:  - 1 month in duration. Sounds IBS related. He has complained of f intermittent bloody stools in the past, but has been hesitant to have colon/rectal exam. He had not completed his FOBT.  - CBC w/Diff - Comprehensive metabolic panel - C-reactive protein - Sed Rate (ESR) - Lipase - Celiac Ab tTG DGP TIgA - TSH - dicyclomine (BENTYL) 10 MG capsule; Take 1 capsule (10 mg total) by mouth 4 (four) times daily -  before meals and at bedtime.  Dispense: 90 capsule; Refill: 1 - diphenoxylate-atropine (LOMOTIL)  2.5-0.025 MG tablet; Take 1 tablet by mouth 4 (four) times daily as needed for diarrhea or loose stools.  Dispense: 120 tablet; Refill: 0 - F/U 2-4 weeks   electronically signed by:  Howard Pouch, DO  [place]

## 2016-02-28 NOTE — Patient Instructions (Signed)
I will call you with labs once I receive all results.  Start bentyl as prescribed on bottle. Continue to take this until you follow up in 2-4 weeks.  Start lomotil. When you start lomotil take 2 tabs then take 1 tab as directed on the bottle. After you start to have formed stools start decreasing this dose until you are able to stop use.  Follow up in 2-4 weeks.   Irritable Bowel Syndrome, Adult Irritable bowel syndrome (IBS) is not one specific disease. It is a group of symptoms that affects the organs responsible for digestion (gastrointestinal or GI tract).  To regulate how your GI tract works, your body sends signals back and forth between your intestines and your brain. If you have IBS, there may be a problem with these signals. As a result, your GI tract does not function normally. Your intestines may become more sensitive and overreact to certain things. This is especially true when you eat certain foods or when you are under stress.  There are four types of IBS. These may be determined based on the consistency of your stool:   IBS with diarrhea.   IBS with constipation.   Mixed IBS.   Unsubtyped IBS.  It is important to know which type of IBS you have. Some treatments are more likely to be helpful for certain types of IBS.  CAUSES  The exact cause of IBS is not known. RISK FACTORS You may have a higher risk of IBS if:  You are a woman.  You are younger than 29 years old.  You have a family history of IBS.  You have mental health problems.  You have had bacterial infection of your GI tract. SIGNS AND SYMPTOMS  Symptoms of IBS vary from person to person. The main symptom is abdominal pain or discomfort. Additional symptoms usually include one or more of the following:   Diarrhea, constipation, or both.   Abdominal swelling or bloating.   Feeling full or sick after eating a small or regular-size meal.   Frequent gas.   Mucus in the stool.   A feeling of  having more stool left after a bowel movement.  Symptoms tend to come and go. They may be associated with stress, psychiatric conditions, or nothing at all.  DIAGNOSIS  There is no specific test to diagnose IBS. Your health care provider will make a diagnosis based on a physical exam, medical history, and your symptoms. You may have other tests to rule out other conditions that may be causing your symptoms. These may include:   Blood tests.   X-rays.   CT scan.  Endoscopy and colonoscopy. This is a test in which your GI tract is viewed with a long, thin, flexible tube. TREATMENT There is no cure for IBS, but treatment can help relieve symptoms. IBS treatment often includes:   Changes to your diet, such as:  Eating more fiber.  Avoiding foods that cause symptoms.  Drinking more water.  Eating regular, medium-sized portioned meals.  Medicines. These may include:  Fiber supplements if you have constipation.  Medicine to control diarrhea (antidiarrheal medicines).  Medicine to help control muscle spasms in your GI tract (antispasmodic medicines).  Medicines to help with any mental health issues, such as antidepressants or tranquilizers.  Therapy.  Talk therapy may help with anxiety, depression, or other mental health issues that can make IBS symptoms worse.  Stress reduction.  Managing your stress can help keep symptoms under control. HOME CARE INSTRUCTIONS  Take medicines only as directed by your health care provider.  Eat a healthy diet.  Avoid foods and drinks with added sugar.  Include more whole grains, fruits, and vegetables gradually into your diet. This may be especially helpful if you have IBS with constipation.  Avoid any foods and drinks that make your symptoms worse. These may include dairy products and caffeinated or carbonated drinks.  Do not eat large meals.  Drink enough fluid to keep your urine clear or pale yellow.  Exercise regularly. Ask  your health care provider for recommendations of good activities for you.  Keep all follow-up visits as directed by your health care provider. This is important. SEEK MEDICAL CARE IF:   You have constant pain.  You have trouble or pain with swallowing.  You have worsening diarrhea. SEEK IMMEDIATE MEDICAL CARE IF:   You have severe and worsening abdominal pain.   You have diarrhea and:   You have a rash, stiff neck, or severe headache.   You are irritable, sleepy, or difficult to awaken.   You are weak, dizzy, or extremely thirsty.   You have bright red blood in your stool or you have black tarry stools.   You have unusual abdominal swelling that is painful.   You vomit continuously.   You vomit blood (hematemesis).   You have both abdominal pain and a fever.    This information is not intended to replace advice given to you by your health care provider. Make sure you discuss any questions you have with your health care provider.   Document Released: 08/28/2005 Document Revised: 09/18/2014 Document Reviewed: 05/15/2014 Elsevier Interactive Patient Education [employer]2016 Else[employer] Inc.

## 2016-02-29 DIAGNOSIS — M9903 Segmental and somatic dysfunction of lumbar region: Secondary | ICD-10-CM | POA: Diagnosis not present

## 2016-02-29 DIAGNOSIS — M545 Low back pain: Secondary | ICD-10-CM | POA: Diagnosis not present

## 2016-02-29 DIAGNOSIS — M5136 Other intervertebral disc degeneration, lumbar region: Secondary | ICD-10-CM | POA: Diagnosis not present

## 2016-02-29 DIAGNOSIS — M62830 Muscle spasm of back: Secondary | ICD-10-CM | POA: Diagnosis not present

## 2016-02-29 LAB — SEDIMENTATION RATE: Sed Rate: 1 mm/hr (ref 0–15)

## 2016-02-29 LAB — CELIAC AB TTG DGP TIGA
Antigliadin Abs, IgA: 5 units (ref 0–19)
Gliadin IgG: 2 units (ref 0–19)
IGA/IMMUNOGLOBULIN A, SERUM: 182 mg/dL (ref 90–386)
Transglutaminase IgA: <2 U/mL (ref 0–3)

## 2016-03-01 ENCOUNTER — Telehealth: Payer: Self-pay | Admitting: Family Medicine

## 2016-03-01 NOTE — Telephone Encounter (Signed)
Please call pt: - His labs are normal, with an exception of a  mildly low alkaline phosphatase. This is likely from his chronic diarrhea. We will discuss in detail at his follow up appt

## 2016-03-01 NOTE — Telephone Encounter (Signed)
Left message with lab results on [facility] voice [facility]

## 2016-03-02 DIAGNOSIS — M62830 Muscle spasm of back: Secondary | ICD-10-CM | POA: Diagnosis not present

## 2016-03-02 DIAGNOSIS — M545 Low back pain: Secondary | ICD-10-CM | POA: Diagnosis not present

## 2016-03-02 DIAGNOSIS — M9903 Segmental and somatic dysfunction of lumbar region: Secondary | ICD-10-CM | POA: Diagnosis not present

## 2016-03-02 DIAGNOSIS — M5136 Other intervertebral disc degeneration, lumbar region: Secondary | ICD-10-CM | POA: Diagnosis not present

## 2016-03-07 DIAGNOSIS — M545 Low back pain: Secondary | ICD-10-CM | POA: Diagnosis not present

## 2016-03-07 DIAGNOSIS — M9903 Segmental and somatic dysfunction of lumbar region: Secondary | ICD-10-CM | POA: Diagnosis not present

## 2016-03-07 DIAGNOSIS — M62830 Muscle spasm of back: Secondary | ICD-10-CM | POA: Diagnosis not present

## 2016-03-07 DIAGNOSIS — M5136 Other intervertebral disc degeneration, lumbar region: Secondary | ICD-10-CM | POA: Diagnosis not present

## 2016-03-09 DIAGNOSIS — M5136 Other intervertebral disc degeneration, lumbar region: Secondary | ICD-10-CM | POA: Diagnosis not present

## 2016-03-09 DIAGNOSIS — M62830 Muscle spasm of back: Secondary | ICD-10-CM | POA: Diagnosis not present

## 2016-03-09 DIAGNOSIS — M545 Low back pain: Secondary | ICD-10-CM | POA: Diagnosis not present

## 2016-03-09 DIAGNOSIS — M9903 Segmental and somatic dysfunction of lumbar region: Secondary | ICD-10-CM | POA: Diagnosis not present

## 2016-03-13 DIAGNOSIS — M9903 Segmental and somatic dysfunction of lumbar region: Secondary | ICD-10-CM | POA: Diagnosis not present

## 2016-03-13 DIAGNOSIS — M545 Low back pain: Secondary | ICD-10-CM | POA: Diagnosis not present

## 2016-03-13 DIAGNOSIS — M62830 Muscle spasm of back: Secondary | ICD-10-CM | POA: Diagnosis not present

## 2016-03-13 DIAGNOSIS — M5136 Other intervertebral disc degeneration, lumbar region: Secondary | ICD-10-CM | POA: Diagnosis not present

## 2016-03-16 DIAGNOSIS — M5136 Other intervertebral disc degeneration, lumbar region: Secondary | ICD-10-CM | POA: Diagnosis not present

## 2016-03-16 DIAGNOSIS — M545 Low back pain: Secondary | ICD-10-CM | POA: Diagnosis not present

## 2016-03-16 DIAGNOSIS — M9903 Segmental and somatic dysfunction of lumbar region: Secondary | ICD-10-CM | POA: Diagnosis not present

## 2016-03-16 DIAGNOSIS — M62830 Muscle spasm of back: Secondary | ICD-10-CM | POA: Diagnosis not present

## 2016-03-17 ENCOUNTER — Encounter: Payer: Self-pay | Admitting: Family Medicine

## 2016-03-17 ENCOUNTER — Ambulatory Visit (INDEPENDENT_AMBULATORY_CARE_PROVIDER_SITE_OTHER): Payer: BLUE CROSS/BLUE SHIELD | Admitting: Family Medicine

## 2016-03-17 VITALS — BP 113/70 | HR 70 | Temp 98.4°F | Resp 20 | Ht 76.0 in | Wt 205.2 lb

## 2016-03-17 DIAGNOSIS — R197 Diarrhea, unspecified: Secondary | ICD-10-CM | POA: Diagnosis not present

## 2016-03-17 DIAGNOSIS — K591 Functional diarrhea: Secondary | ICD-10-CM | POA: Diagnosis not present

## 2016-03-17 DIAGNOSIS — R634 Abnormal weight loss: Secondary | ICD-10-CM

## 2016-03-17 MED ORDER — DIPHENOXYLATE-ATROPINE 2.5-0.025 MG PO TABS: 1.0000 | ORAL_TABLET | Freq: Four times a day (QID) | ORAL | Status: DC | PRN

## 2016-03-17 NOTE — Patient Instructions (Signed)
Drop off stool studies as soon as possible.  Once I have results and rule out it is not infectious causes, we can try the amitriptyline medication.  Make sure to continue the medication as prescribed.  If we start the new medication will need to see you 4 weeks after start of medicine.

## 2016-03-17 NOTE — Progress Notes (Signed)
Patient ID: Paul [name], male   DOB: [birth year], 30 y.o.   MRN: [id number]    Leam Madero , February 28, 1986, 30 y.o., male MRN: [id number]  CC: Diarrhea Subjective:  Patient presents for follow up to complaints of diarrhea for > 1 month duration. 3 weeks ago he was started on bentyl and lomotil. CBC, CMP, gluten panel, ESR, CRP, TSH and lipase normal. FOBT not completed by patient. He has lost 4.0 lbs since last OV 3 weeks ago, however this is his normal weight from prior to his honeymoon. He states the medications have helped. He still has some diarrhea on occassions, but not like it had been. He believes some of it may be when he drinks alcohol. Overall improvement on medications. He denies fever, chills, nausea or vomit, melena or hematochezia.   Prior OV 02/28/2016 Pt presents for an acute OV with complaints of Diarrhea of 1 month  Duration. He states every time he eats,  he has to go to bathroom with  watery diarrhea. He endorses mild cramping discomfort, but no abdominal pain. He endorses relief after BM. He denies night time symptoms, fever, chills, weight loss, change in diet, rash. Patient states he has never had a "great colon." He has never had symptoms as severe as he does currently. He can not associate it directly with a certain type of food, but states beer and fatty food might make it worse. He states he feels the symptoms started right after his return from [place] for his honeymoon. They stayed on an resort and did not eat any meals off the resort. His wife is not ill. He has had anxiety issues in the past, he currently is not on medications.  Associated symptoms include . Pt feels symptoms after his honeymoon in [place]. He denies fhx of IBD, IBS or colon cancer.     No Known Allergies Social History  Substance Use Topics  . Smoking status: Never Smoker   . Smokeless tobacco: Never Used  . Alcohol Use: Yes     Comment: 3x a week.    Past Medical History  Diagnosis Date  .  Arthritis   . Heart murmur     present since childhood, normal echo 2002  . Back pain     03/17/12 xray: minimal compression deformity L1. 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  . Tinea versicolor   . Chickenpox     childhood    History reviewed. No pertinent past surgical history. Family History  Problem Relation Age of Onset  . Alcohol abuse Mother   . Lung cancer Father 39    nonsmoker  . Breast cancer Maternal Grandmother 40     Medication List       This list is accurate as of: 03/17/16  8:18 AM.  Always use your most recent med list.               clotrimazole 1 % cream  Commonly known as:  LOTRIMIN  Apply 1 application topically 2 (two) times daily.     dicyclomine 10 MG capsule  Commonly known as:  BENTYL  Take 1 capsule (10 mg total) by mouth 4 (four) times daily -  before meals and at bedtime.     diphenoxylate-atropine 2.5-0.025 MG tablet  Commonly known as:  LOMOTIL  Take 1 tablet by mouth 4 (four) times daily as needed for diarrhea or loose stools.     hydrocortisone 25 MG suppository  Commonly known as:  ANUSOL-HC  Place  1 suppository (25 mg total) rectally 2 (two) times daily.     traZODone 100 MG tablet  Commonly known as:  DESYREL  Take 1 tablet (100 mg total) by mouth at bedtime as needed for sleep.     triamcinolone cream 0.1 %  Commonly known as:  KENALOG  Apply 1 application topically 2 (two) times daily.       ROS: Negative, with the exception of above mentioned in HPI Objective:  BP 113/70 mmHg  Pulse 70  Temp(Src) 98.4 F (36.9 C)  Resp 20  Ht 6' 4" (1.93 m)  Wt 205 lb 4 oz (93.101 kg)  BMI 24.99 kg/m2  SpO2 100% Body mass index is 24.99 kg/(m^2). Gen: Afebrile. No acute distress. Nontoxic in appearance, well developed, well nourished. Pleasant male.  HENT: AT. [place].MMM, no oral lesions.  Eyes:Pupils Equal Round Reactive to light, Extraocular movements intact,  Conjunctiva without redness, discharge or  icterus. CV: RRR  Abd: Soft. Flat. NTND. BS present. no Masses palpated. No rebound or guarding.  Skin: no rashes, purpura or petechiae.  Neuro: Normal gait. PERLA. EOMi. Alert. Oriented x3  Psych: Normal affect, dress and demeanor. Normal speech. Normal thought content and judgment..   Assessment/Plan: Amahd Morino is a 30 y.o. male present for acute OV for  Diarrhea, unspecified type/h/o bloody stools/weight loss:  - >6 weeks duration. Sounds IBS related. He has complained of  intermittent bloody stools in the past, but has been hesitant to have colon/rectal exam. He had not completed his FOBT. He is agreeable to stool studies today. - Current labs normal.   - Stool studies ordered, WBC, lactoferrin, ova parasites ordered.  - consider amitriptyline if studies normal.  - F/U 4 weeks if no improvement or start amitriptyline.    > 25 minutes spent with patient, >50% of time spent face to face counseling patient and coordinating care.   electronically signed by:  Howard Pouch, DO  [place]

## 2016-03-21 DIAGNOSIS — M9903 Segmental and somatic dysfunction of lumbar region: Secondary | ICD-10-CM | POA: Diagnosis not present

## 2016-03-21 DIAGNOSIS — M545 Low back pain: Secondary | ICD-10-CM | POA: Diagnosis not present

## 2016-03-21 DIAGNOSIS — M62830 Muscle spasm of back: Secondary | ICD-10-CM | POA: Diagnosis not present

## 2016-03-21 DIAGNOSIS — M5136 Other intervertebral disc degeneration, lumbar region: Secondary | ICD-10-CM | POA: Diagnosis not present

## 2016-03-23 DIAGNOSIS — M9903 Segmental and somatic dysfunction of lumbar region: Secondary | ICD-10-CM | POA: Diagnosis not present

## 2016-03-23 DIAGNOSIS — M545 Low back pain: Secondary | ICD-10-CM | POA: Diagnosis not present

## 2016-03-23 DIAGNOSIS — M62830 Muscle spasm of back: Secondary | ICD-10-CM | POA: Diagnosis not present

## 2016-03-23 DIAGNOSIS — M5136 Other intervertebral disc degeneration, lumbar region: Secondary | ICD-10-CM | POA: Diagnosis not present

## 2016-03-28 DIAGNOSIS — M545 Low back pain: Secondary | ICD-10-CM | POA: Diagnosis not present

## 2016-03-28 DIAGNOSIS — M5136 Other intervertebral disc degeneration, lumbar region: Secondary | ICD-10-CM | POA: Diagnosis not present

## 2016-03-28 DIAGNOSIS — M62830 Muscle spasm of back: Secondary | ICD-10-CM | POA: Diagnosis not present

## 2016-03-28 DIAGNOSIS — M9903 Segmental and somatic dysfunction of lumbar region: Secondary | ICD-10-CM | POA: Diagnosis not present

## 2016-03-30 DIAGNOSIS — M5136 Other intervertebral disc degeneration, lumbar region: Secondary | ICD-10-CM | POA: Diagnosis not present

## 2016-03-30 DIAGNOSIS — M62830 Muscle spasm of back: Secondary | ICD-10-CM | POA: Diagnosis not present

## 2016-03-30 DIAGNOSIS — M545 Low back pain: Secondary | ICD-10-CM | POA: Diagnosis not present

## 2016-03-30 DIAGNOSIS — M9903 Segmental and somatic dysfunction of lumbar region: Secondary | ICD-10-CM | POA: Diagnosis not present

## 2016-04-04 DIAGNOSIS — M5136 Other intervertebral disc degeneration, lumbar region: Secondary | ICD-10-CM | POA: Diagnosis not present

## 2016-04-04 DIAGNOSIS — M9903 Segmental and somatic dysfunction of lumbar region: Secondary | ICD-10-CM | POA: Diagnosis not present

## 2016-04-04 DIAGNOSIS — M62830 Muscle spasm of back: Secondary | ICD-10-CM | POA: Diagnosis not present

## 2016-04-04 DIAGNOSIS — M545 Low back pain: Secondary | ICD-10-CM | POA: Diagnosis not present

## 2016-04-06 DIAGNOSIS — M62830 Muscle spasm of back: Secondary | ICD-10-CM | POA: Diagnosis not present

## 2016-04-06 DIAGNOSIS — M9903 Segmental and somatic dysfunction of lumbar region: Secondary | ICD-10-CM | POA: Diagnosis not present

## 2016-04-06 DIAGNOSIS — M545 Low back pain: Secondary | ICD-10-CM | POA: Diagnosis not present

## 2016-04-06 DIAGNOSIS — M5136 Other intervertebral disc degeneration, lumbar region: Secondary | ICD-10-CM | POA: Diagnosis not present

## 2016-04-11 DIAGNOSIS — M9903 Segmental and somatic dysfunction of lumbar region: Secondary | ICD-10-CM | POA: Diagnosis not present

## 2016-04-11 DIAGNOSIS — M62830 Muscle spasm of back: Secondary | ICD-10-CM | POA: Diagnosis not present

## 2016-04-11 DIAGNOSIS — M545 Low back pain: Secondary | ICD-10-CM | POA: Diagnosis not present

## 2016-04-11 DIAGNOSIS — M5136 Other intervertebral disc degeneration, lumbar region: Secondary | ICD-10-CM | POA: Diagnosis not present

## 2016-04-17 ENCOUNTER — Other Ambulatory Visit: Payer: Self-pay | Admitting: *Deleted

## 2016-04-17 DIAGNOSIS — R197 Diarrhea, unspecified: Secondary | ICD-10-CM

## 2016-04-17 MED ORDER — DICYCLOMINE HCL 10 MG PO CAPS: 10.0000 mg | ORAL_CAPSULE | Freq: Three times a day (TID) | ORAL | 1 refills | Status: DC

## 2016-05-04 ENCOUNTER — Encounter: Payer: BLUE CROSS/BLUE SHIELD | Admitting: Family Medicine

## 2016-05-08 ENCOUNTER — Encounter: Payer: Self-pay | Admitting: Family Medicine

## 2016-05-08 ENCOUNTER — Ambulatory Visit (INDEPENDENT_AMBULATORY_CARE_PROVIDER_SITE_OTHER): Payer: BLUE CROSS/BLUE SHIELD | Admitting: Family Medicine

## 2016-05-08 VITALS — BP 110/73 | HR 56 | Temp 98.4°F | Resp 20 | Ht 76.0 in | Wt 206.8 lb

## 2016-05-08 DIAGNOSIS — Z0000 Encounter for general adult medical examination without abnormal findings: Secondary | ICD-10-CM | POA: Diagnosis not present

## 2016-05-08 DIAGNOSIS — Z23 Encounter for immunization: Secondary | ICD-10-CM | POA: Diagnosis not present

## 2016-05-08 DIAGNOSIS — Z114 Encounter for screening for human immunodeficiency virus [HIV]: Secondary | ICD-10-CM | POA: Diagnosis not present

## 2016-05-08 DIAGNOSIS — K589 Irritable bowel syndrome without diarrhea: Secondary | ICD-10-CM | POA: Diagnosis not present

## 2016-05-08 LAB — HIV ANTIBODY (ROUTINE TESTING W REFLEX): HIV 1&2 Ab, 4th Generation: NONREACTIVE

## 2016-05-08 NOTE — Progress Notes (Signed)
Patient ID: Paul [name], male  DOB: 1986/03/09, 30 y.o.   MRN: [id number] Patient Care Team    Relationship Specialty Notifications Start End  Ma Hillock, DO PCP - General Family Medicine  05/04/15     Subjective:  Paul [name] is a 30 y.o. male present for CPE. All past medical history, surgical history, allergies, family history, immunizations, medications and social history were updated in the electronic medical record today. All recent labs, ED visits and hospitalizations within the last year were reviewed.  Health maintenance:  Colonoscopy: No family history of colon cancer, routine screening at age 43 PSA: No family history of prostate cancer, routine screening  Immunizations: Tetanus up-to-date, Flu shot administered today. Infectious disease screening: HIV screen completed today Assistive device: None Oxygen use: None Patient has a Dental home. Hospitalizations/ED visits: None    Immunization History  Administered Date(s) Administered  . DTaP 01/14/1987, 03/16/1987, 05/18/1987, 06/02/1988, 02/13/1992  . Hepatitis B 07/07/1992, 04/28/1997, 09/15/1997  . HiB (PRP-OMP) 06/02/1988  . IPV 01/14/1987, 03/16/1987, 06/02/1988, 02/13/1992  . MMR 02/11/1987, 04/29/1999  . Meningococcal Conjugate 04/10/2005  . Td 04/12/2015     Past Medical History:  Diagnosis Date  . Arthritis   . Back pain    03/17/12 xray: minimal compression deformity L1. 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  . Chickenpox    childhood   . Heart murmur    present since childhood, normal echo 2002  . Tinea versicolor    No Known Allergies History reviewed. No pertinent surgical history. Family History  Problem Relation Age of Onset  . Alcohol abuse Mother   . Lung cancer Father 22    nonsmoker  . Breast cancer Maternal Grandmother 30   Social History   Social History  . Marital status: Single    Spouse name: N/A  . Number of children: N/A  . Years of  education: N/A   Occupational History  . Not on file.   Social History Main Topics  . Smoking status: Never Smoker  . Smokeless tobacco: Never Used  . Alcohol use Yes     Comment: 3x a week.   . Drug use: No  . Sexual activity: Yes    Birth control/ protection: None   Other Topics Concern  . Not on file   Social History Narrative   Recently moved from [place]. Engaged to [employer]. Wedding Date 01/2016. 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 Wears his seatbelt. Wears a bike helmet. Exercises more than 3 times a week. Has a smoke alarm the home. Has firearms in the home.     Medication List       Accurate as of 05/08/16 11:32 AM. Always use your most recent med list.          clotrimazole 1 % cream Commonly known as:  LOTRIMIN Apply 1 application topically 2 (two) times daily.   dicyclomine 10 MG capsule Commonly known as:  BENTYL Take 1 capsule (10 mg total) by mouth 4 (four) times daily -  before meals and at bedtime.   diphenoxylate-atropine 2.5-0.025 MG tablet Commonly known as:  LOMOTIL Take 1 tablet by mouth 4 (four) times daily as needed for diarrhea or loose stools.   hydrocortisone 25 MG suppository Commonly known as:  ANUSOL-HC Place 1 suppository (25 mg total) rectally 2 (two) times daily.   traZODone 100 MG tablet Commonly known as:  DESYREL Take 1 tablet (100 mg total)  by mouth at bedtime as needed for sleep.   triamcinolone cream 0.1 % Commonly known as:  KENALOG Apply 1 application topically 2 (two) times daily.        Recent Results (from the past 2160 hour(s))  CBC w/Diff     Status: None   Collection Time: 02/28/16  4:49 PM  Result Value Ref Range   WBC 6.0 3.8 - 10.8 K/uL   RBC 4.67 4.20 - 5.80 MIL/uL   Hemoglobin 13.7 13.2 - 17.1 g/dL   HCT 40.7 38.5 - 50.0 %   MCV 87.2 80.0 - 100.0 fL   MCH 29.3 27.0 - 33.0 pg   MCHC 33.7 32.0 - 36.0 g/dL   RDW 13.4 11.0 - 15.0 %   Platelets 315 140 - 400 K/uL   MPV  8.8 7.5 - 12.5 fL   Neutro Abs 3,480 1,500 - 7,800 cells/uL   Lymphs Abs 1,980 850 - 3,900 cells/uL   Monocytes Absolute 480 200 - 950 cells/uL   Eosinophils Absolute 60 15 - 500 cells/uL   Basophils Absolute 0 0 - 200 cells/uL   Neutrophils Relative % 58 %   Lymphocytes Relative 33 %   Monocytes Relative 8 %   Eosinophils Relative 1 %   Basophils Relative 0 %   Smear Review Criteria for review not met     Comment: ** Please note change in unit of measure and reference range(s). **  Comprehensive metabolic panel     Status: Abnormal   Collection Time: 02/28/16  4:49 PM  Result Value Ref Range   Sodium 142 135 - 146 mmol/L   Potassium 4.2 3.5 - 5.3 mmol/L   Chloride 104 98 - 110 mmol/L   CO2 27 20 - 31 mmol/L   Glucose, Bld 89 65 - 99 mg/dL   BUN 17 7 - 25 mg/dL   Creat 1.17 0.60 - 1.35 mg/dL   Total Bilirubin 0.4 0.2 - 1.2 mg/dL   Alkaline Phosphatase 31 (L) 40 - 115 U/L   AST 19 10 - 40 U/L   ALT 20 9 - 46 U/L   Total Protein 7.1 6.1 - 8.1 g/dL   Albumin 4.7 3.6 - 5.1 g/dL   Calcium 9.5 8.6 - 10.3 mg/dL  C-reactive protein     Status: None   Collection Time: 02/28/16  4:49 PM  Result Value Ref Range   CRP <0.5 <0.60 mg/dL  Sed Rate (ESR)     Status: None   Collection Time: 02/28/16  4:49 PM  Result Value Ref Range   Sed Rate 1 0 - 15 mm/hr  Lipase     Status: None   Collection Time: 02/28/16  4:49 PM  Result Value Ref Range   Lipase 34 7 - 60 U/L  Celiac Ab tTG DGP TIgA     Status: None   Collection Time: 02/28/16  4:49 PM  Result Value Ref Range   IgA/Immunoglobulin A, Serum 182 90 - 386 mg/dL   Antigliadin Abs, IgA 5 0 - 19 units    Comment:                    Negative                   0 - 19                    Weak Positive             20 -  30                    Moderate to Strong Positive   >30    Gliadin IgG 2 0 - 19 units    Comment:                    Negative                   0 - 19                    Weak Positive             20 - 30                     Moderate to Strong Positive   >30    Transglutaminase IgA <2 0 - 3 U/mL    Comment:                               Negative        0 -  3                               Weak Positive   4 - 10                               Positive           >10  Tissue Transglutaminase (tTG) has been identified  as the endomysial antigen.  Studies have demonstr-  ated that endomysial IgA antibodies have over 99%  specificity for gluten sensitive enteropathy.    Tissue Transglut Ab <2 0 - 5 U/mL    Comment:                               Negative        0 - 5                               Weak Positive   6 - 9                               Positive           >9   TSH     Status: None   Collection Time: 02/28/16  4:49 PM  Result Value Ref Range   TSH 2.02 0.40 - 4.50 mIU/L    Patient was never admitted.   ROS: 14 pt review of systems performed and negative (unless mentioned in an HPI)  Objective: BP 110/73 (BP Location: Left Arm, Patient Position: Sitting, Cuff Size: Large)   Pulse (!) 56   Temp 98.4 F (36.9 C) (Oral)   Resp 20   Ht _0  (1.93 m)   Wt 206 lb 12 oz (93.8 kg)   SpO2 96%   BMI 25.17 kg/m  Gen: Afebrile. No acute distress. Nontoxic in appearance, well-developed, well-nourished,  Pleasant caucasian male.  HENT: AT. [place]. Bilateral TM visualized and normal in appearance, normal external auditory canal. MMM, no oral lesions, adequate dentition. Bilateral nares within normal limits. Throat without erythema, ulcerations  or exudates. no Cough on exam, no hoarseness on exam. Eyes:Pupils Equal Round Reactive to light, Extraocular movements intact,  Conjunctiva without redness, discharge or icterus. Neck/lymp/endocrine: Supple,no lymphadenopathy, no thyromegaly CV: RRR no murmur, no edema, +2/4 P posterior tibialis pulses. Chest: CTAB, no wheeze, rhonchi or crackles. normal Respiratory effort. good Air movement. Abd: Soft. flat. NTND. BS present. no Masses palpated. No hepatosplenomegaly.  No rebound tenderness or guarding. Skin: no rashes, purpura or petechiae. Warm and well-perfused. Skin intact. Neuro/Msk:  Normal gait. PERLA. EOMi. Alert. Oriented x3.  Cranial nerves II through XII intact. Muscle strength 5/5 upper/lower extremity. DTRs equal bilaterally. Psych: Normal affect, dress and demeanor. Normal speech. Normal thought content and judgment.   Assessment/plan: Paul [name] is a 30 y.o. male present for CPE. Encounter for preventive health examination Patient was encouraged to exercise greater than 150 minutes a week. Patient was encouraged to choose a diet filled with fresh fruits and vegetables, and lean meats. AVS provided to patient today for education/recommendation on gender specific health and safety maintenance. Colonoscopy: No family history of colon cancer, routine screening at age 75 PSA: No family history of prostate cancer, routine screening  Immunizations: Tetanus up-to-date, Flu shot administered today. Infectious disease screening: HIV screen completed today - Screening labs completed through employment in November, pt to bring copy of results once available.    IBS (irritable bowel syndrome) - pt doing well, only using low dose bentyl prior to meals.  - discussed if he would like can attempt to wean of bentyl. Using lowest needed for control of symptoms.   Enounter for screening for HIV - HIV antibody (with reflex)  Influenza vaccine administered - Flu Vaccine QUAD 36+ mos PF IM (Fluarix & Fluzone Quad PF)  6 months if IBS meds needed Yearly for CPE  Electronically signed by: Howard Pouch, DO [place]

## 2016-05-08 NOTE — Patient Instructions (Signed)

## 2016-05-09 ENCOUNTER — Telehealth: Payer: Self-pay | Admitting: Family Medicine

## 2016-05-09 NOTE — Telephone Encounter (Signed)
Please call pt: - lab test is normal. (HIV screen- negative)

## 2016-05-09 NOTE — Telephone Encounter (Signed)
Spoke with patient reviewed lab results. 

## 2016-06-20 ENCOUNTER — Other Ambulatory Visit: Payer: Self-pay | Admitting: *Deleted

## 2016-06-20 DIAGNOSIS — R197 Diarrhea, unspecified: Secondary | ICD-10-CM

## 2016-06-20 MED ORDER — DICYCLOMINE HCL 10 MG PO CAPS: 10.0000 mg | ORAL_CAPSULE | Freq: Three times a day (TID) | ORAL | 1 refills | Status: DC

## 2016-08-09 ENCOUNTER — Other Ambulatory Visit: Payer: Self-pay | Admitting: *Deleted

## 2016-08-09 DIAGNOSIS — R197 Diarrhea, unspecified: Secondary | ICD-10-CM

## 2016-08-09 MED ORDER — DICYCLOMINE HCL 10 MG PO CAPS: 10.0000 mg | ORAL_CAPSULE | Freq: Three times a day (TID) | ORAL | 1 refills | Status: DC

## 2016-09-13 DIAGNOSIS — M545 Low back pain: Secondary | ICD-10-CM | POA: Diagnosis not present

## 2016-09-13 DIAGNOSIS — M62830 Muscle spasm of back: Secondary | ICD-10-CM | POA: Diagnosis not present

## 2016-09-13 DIAGNOSIS — M9903 Segmental and somatic dysfunction of lumbar region: Secondary | ICD-10-CM | POA: Diagnosis not present

## 2016-09-13 DIAGNOSIS — M5136 Other intervertebral disc degeneration, lumbar region: Secondary | ICD-10-CM | POA: Diagnosis not present

## 2016-10-14 ENCOUNTER — Other Ambulatory Visit: Payer: Self-pay | Admitting: Family Medicine

## 2016-10-14 DIAGNOSIS — R197 Diarrhea, unspecified: Secondary | ICD-10-CM

## 2016-10-18 DIAGNOSIS — M5136 Other intervertebral disc degeneration, lumbar region: Secondary | ICD-10-CM | POA: Diagnosis not present

## 2016-10-18 DIAGNOSIS — M62830 Muscle spasm of back: Secondary | ICD-10-CM | POA: Diagnosis not present

## 2016-10-18 DIAGNOSIS — M545 Low back pain: Secondary | ICD-10-CM | POA: Diagnosis not present

## 2016-10-18 DIAGNOSIS — M9903 Segmental and somatic dysfunction of lumbar region: Secondary | ICD-10-CM | POA: Diagnosis not present

## 2016-11-23 DIAGNOSIS — M62830 Muscle spasm of back: Secondary | ICD-10-CM | POA: Diagnosis not present

## 2016-11-23 DIAGNOSIS — M5136 Other intervertebral disc degeneration, lumbar region: Secondary | ICD-10-CM | POA: Diagnosis not present

## 2016-11-23 DIAGNOSIS — M9903 Segmental and somatic dysfunction of lumbar region: Secondary | ICD-10-CM | POA: Diagnosis not present

## 2016-11-23 DIAGNOSIS — M545 Low back pain: Secondary | ICD-10-CM | POA: Diagnosis not present

## 2016-12-04 ENCOUNTER — Other Ambulatory Visit: Payer: Self-pay | Admitting: *Deleted

## 2016-12-04 DIAGNOSIS — R197 Diarrhea, unspecified: Secondary | ICD-10-CM

## 2016-12-04 MED ORDER — DICYCLOMINE HCL 10 MG PO CAPS: 10.0000 mg | ORAL_CAPSULE | Freq: Three times a day (TID) | ORAL | 0 refills | Status: DC

## 2016-12-27 DIAGNOSIS — M62830 Muscle spasm of back: Secondary | ICD-10-CM | POA: Diagnosis not present

## 2016-12-27 DIAGNOSIS — M9903 Segmental and somatic dysfunction of lumbar region: Secondary | ICD-10-CM | POA: Diagnosis not present

## 2016-12-27 DIAGNOSIS — M5136 Other intervertebral disc degeneration, lumbar region: Secondary | ICD-10-CM | POA: Diagnosis not present

## 2016-12-27 DIAGNOSIS — M545 Low back pain: Secondary | ICD-10-CM | POA: Diagnosis not present

## 2017-01-23 DIAGNOSIS — M545 Low back pain: Secondary | ICD-10-CM | POA: Diagnosis not present

## 2017-01-23 DIAGNOSIS — M5136 Other intervertebral disc degeneration, lumbar region: Secondary | ICD-10-CM | POA: Diagnosis not present

## 2017-01-23 DIAGNOSIS — M9903 Segmental and somatic dysfunction of lumbar region: Secondary | ICD-10-CM | POA: Diagnosis not present

## 2017-01-23 DIAGNOSIS — M62830 Muscle spasm of back: Secondary | ICD-10-CM | POA: Diagnosis not present

## 2017-01-31 ENCOUNTER — Encounter: Payer: Self-pay | Admitting: Family Medicine

## 2017-01-31 ENCOUNTER — Ambulatory Visit (INDEPENDENT_AMBULATORY_CARE_PROVIDER_SITE_OTHER): Payer: BLUE CROSS/BLUE SHIELD | Admitting: Family Medicine

## 2017-01-31 VITALS — BP 121/78 | HR 94 | Temp 98.7°F | Resp 20 | Wt 203.5 lb

## 2017-01-31 DIAGNOSIS — W57XXXA Bitten or stung by nonvenomous insect and other nonvenomous arthropods, initial encounter: Secondary | ICD-10-CM | POA: Diagnosis not present

## 2017-01-31 DIAGNOSIS — R509 Fever, unspecified: Secondary | ICD-10-CM | POA: Diagnosis not present

## 2017-01-31 DIAGNOSIS — M2550 Pain in unspecified joint: Secondary | ICD-10-CM

## 2017-01-31 MED ORDER — DOXYCYCLINE HYCLATE 100 MG PO CAPS: 100.0000 mg | ORAL_CAPSULE | Freq: Two times a day (BID) | ORAL | 0 refills | Status: DC

## 2017-01-31 NOTE — Progress Notes (Signed)
Paul [name] , August 31, 1986, 31 y.o., male MRN: [id number] Patient Care Team    Relationship Specialty Notifications Start End  Natalia Leatherwood, DO PCP - General Family Medicine  05/04/15     Chief Complaint  Patient presents with  . Insect Bite    tick back of right leg,found Sunday  . Fever    body aches     Subjective: Pt presents for an OV with complaints of tick bite right calf he noticed over a week ago. He endorses removal tick intact. He feels the area was mildly raised and red last week. He endorses development of low grade fever and diffuse arthralgias last few days. He denies rash, headache, nausea, vomit or diarrhea.   Depression screen PHQ 2/9 05/08/2016  Decreased Interest 0  Down, Depressed, Hopeless 0  PHQ - 2 Score 0    No Known Allergies Social History  Substance Use Topics  . Smoking status: Never Smoker  . Smokeless tobacco: Never Used  . Alcohol use Yes     Comment: 3x a week.    Past Medical History:  Diagnosis Date  . Arthritis   . Back pain    03/17/12 xray: minimal compression deformity L1. 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  . Chickenpox    childhood   . Heart murmur    present since childhood, normal echo 2002  . Tinea versicolor    History reviewed. No pertinent surgical history. Family History  Problem Relation Age of Onset  . Alcohol abuse Mother   . Lung cancer Father 61       nonsmoker  . Breast cancer Maternal Grandmother 40   Allergies as of 01/31/2017   No Known Allergies     Medication List       Accurate as of 01/31/17 10:53 AM. Always use your most recent med list.          dicyclomine 10 MG capsule Commonly known as:  BENTYL Take 1 capsule (10 mg total) by mouth 4 (four) times daily -  before meals and at bedtime.   diphenoxylate-atropine 2.5-0.025 MG tablet Commonly known as:  LOMOTIL Take 1 tablet by mouth 4 (four) times daily as needed for diarrhea or loose stools.       All  past medical history, surgical history, allergies, family history, immunizations andmedications were updated in the EMR today and reviewed under the history and medication portions of their EMR.     ROS: Negative, with the exception of above mentioned in HPI   Objective:  BP 121/78 (BP Location: Right Arm, Patient Position: Sitting, Cuff Size: Large)   Pulse 94   Temp 98.7 F (37.1 C)   Resp 20   Wt 203 lb 8 oz (92.3 kg)   SpO2 97%   BMI 24.77 kg/m  Body mass index is 24.77 kg/m. Gen: Afebrile. No acute distress. Nontoxic in appearance, well developed, well nourished.  HENT: AT. [place].  MMM Eyes:Pupils Equal Round Reactive to light, Extraocular movements intact,  Conjunctiva without redness, discharge or icterus. MSK: no erythema, swelling or deformities. FROM.  Skin: Small well healing ticjk bite right calf. No insect remnants by magnification. Mild erythema, mildly raised. no rashes, purpura or petechiae.  Neuro: Normal gait. PERLA. EOMi. Alert. Oriented x3   No exam data present No results found. No results found for this or any previous visit (from the past 24 hour(s)).  Assessment/Plan: Paul [name] is a 30 y.o. male present for OV for  Tick bite, initial encounter Arthralgia, unspecified joint Fever, unspecified fever cause - doxy BID 10 days for systemic symptoms.  - F/U PRN or symptoms worsen or progress, AVS on lyme disease and tick exposure prevention.    Reviewed expectations re: course of current medical issues.  Discussed self-management of symptoms.  Outlined signs and symptoms indicating need for more acute intervention.  Patient verbalized understanding and all questions were answered.  Patient received an After-Visit Summary.     Note is dictated utilizing voice recognition software. Although note has been proof read prior to signing, occasional typographical errors still can be missed. If any questions arise, please do not hesitate to call for  verification.   electronically signed by:  Felix Pacinienee Ori Trejos, DO  [facility] [place]

## 2017-01-31 NOTE — Patient Instructions (Signed)
Tick Bite Information Introduction Ticks are insects that attach themselves to the skin. There are many types of ticks. Common types include wood ticks and deer ticks. Sometimes, ticks carry diseases that can make a person very ill. The most common places for ticks to attach themselves are the scalp, neck, armpits, waist, and groin. HOW CAN YOU PREVENT TICK BITES? Take these steps to help prevent tick bites when you are outdoors:  Wear long sleeves and long pants.  Wear white clothes so you can see ticks more easily.  Tuck your pant legs into your socks.  If walking on a trail, stay in the middle of the trail to avoid brushing against bushes.  Avoid walking through areas with long grass.  Put bug spray on all skin that is showing and along boot tops, pant legs, and sleeve cuffs.  Check clothes, hair, and skin often and before going inside.  Brush off any ticks that are not attached.  Take a shower or bath as soon as possible after being outdoors. HOW SHOULD YOU REMOVE A TICK? Ticks should be removed as soon as possible to help prevent diseases. 1. If latex gloves are available, put them on before trying to remove a tick. 2. Use tweezers to grasp the tick as close to the skin as possible. You may also use curved forceps or a tick removal tool. Grasp the tick as close to its head as possible. Avoid grasping the tick on its body. 3. Pull gently upward until the tick lets go. Do not twist the tick or jerk it suddenly. This may break off the tick's head or mouth parts. 4. Do not squeeze or crush the tick's body. This could force disease-carrying fluids from the tick into your body. 5. After the tick is removed, wash the bite area and your hands with soap and water or alcohol. 6. Apply a small amount of antiseptic cream or ointment to the bite site. 7. Wash any tools that were used. Do not try to remove a tick by applying a hot match, petroleum jelly, or fingernail polish to the tick. These  methods do not work. They may also increase the chances of disease being spread from the tick bite. WHEN SHOULD YOU SEEK HELP? Contact your health care provider if you are unable to remove a tick or if a part of the tick breaks off in the skin. After a tick bite, you need to watch for signs and symptoms of diseases that can be spread by ticks. Contact your health care provider if you develop any of the following:  Fever.  Rash.  Redness and puffiness (swelling) in the area of the tick bite.  Tender, puffy lymph glands.  Watery poop (diarrhea).  Weight loss.  Cough.  Feeling more tired than normal (fatigue).  Muscle, joint, or bone pain.  Belly (abdominal) pain.  Headache.  Change in your level of consciousness.  Trouble walking or moving your legs.  Loss of feeling (numbness) in the legs.  Loss of movement (paralysis).  Shortness of breath.  Confusion.  Throwing up (vomiting) many times. This information is not intended to replace advice given to you by your health care provider. Make sure you discuss any questions you have with your health care provider. Document Released: 11/22/2009 Document Revised: 02/03/2016 Document Reviewed: 02/05/2013 Elsevier Interactive Patient Education  2017 [employer] Inc.  

## 2017-02-13 DIAGNOSIS — M9903 Segmental and somatic dysfunction of lumbar region: Secondary | ICD-10-CM | POA: Diagnosis not present

## 2017-02-13 DIAGNOSIS — M5136 Other intervertebral disc degeneration, lumbar region: Secondary | ICD-10-CM | POA: Diagnosis not present

## 2017-02-13 DIAGNOSIS — M62830 Muscle spasm of back: Secondary | ICD-10-CM | POA: Diagnosis not present

## 2017-02-13 DIAGNOSIS — M545 Low back pain: Secondary | ICD-10-CM | POA: Diagnosis not present

## 2017-03-07 DIAGNOSIS — H52221 Regular astigmatism, right eye: Secondary | ICD-10-CM | POA: Diagnosis not present

## 2017-03-13 DIAGNOSIS — M62830 Muscle spasm of back: Secondary | ICD-10-CM | POA: Diagnosis not present

## 2017-03-13 DIAGNOSIS — M9903 Segmental and somatic dysfunction of lumbar region: Secondary | ICD-10-CM | POA: Diagnosis not present

## 2017-03-13 DIAGNOSIS — M5136 Other intervertebral disc degeneration, lumbar region: Secondary | ICD-10-CM | POA: Diagnosis not present

## 2017-03-13 DIAGNOSIS — M545 Low back pain: Secondary | ICD-10-CM | POA: Diagnosis not present

## 2017-04-17 DIAGNOSIS — M62830 Muscle spasm of back: Secondary | ICD-10-CM | POA: Diagnosis not present

## 2017-04-17 DIAGNOSIS — M9903 Segmental and somatic dysfunction of lumbar region: Secondary | ICD-10-CM | POA: Diagnosis not present

## 2017-04-17 DIAGNOSIS — M545 Low back pain: Secondary | ICD-10-CM | POA: Diagnosis not present

## 2017-04-17 DIAGNOSIS — M5136 Other intervertebral disc degeneration, lumbar region: Secondary | ICD-10-CM | POA: Diagnosis not present

## 2017-05-17 DIAGNOSIS — M546 Pain in thoracic spine: Secondary | ICD-10-CM | POA: Diagnosis not present

## 2017-05-17 DIAGNOSIS — M9902 Segmental and somatic dysfunction of thoracic region: Secondary | ICD-10-CM | POA: Diagnosis not present

## 2017-05-17 DIAGNOSIS — M9901 Segmental and somatic dysfunction of cervical region: Secondary | ICD-10-CM | POA: Diagnosis not present

## 2017-05-17 DIAGNOSIS — M542 Cervicalgia: Secondary | ICD-10-CM | POA: Diagnosis not present

## 2017-06-14 DIAGNOSIS — M9902 Segmental and somatic dysfunction of thoracic region: Secondary | ICD-10-CM | POA: Diagnosis not present

## 2017-06-14 DIAGNOSIS — M9901 Segmental and somatic dysfunction of cervical region: Secondary | ICD-10-CM | POA: Diagnosis not present

## 2017-06-14 DIAGNOSIS — M542 Cervicalgia: Secondary | ICD-10-CM | POA: Diagnosis not present

## 2017-06-14 DIAGNOSIS — M546 Pain in thoracic spine: Secondary | ICD-10-CM | POA: Diagnosis not present

## 2017-07-19 DIAGNOSIS — M542 Cervicalgia: Secondary | ICD-10-CM | POA: Diagnosis not present

## 2017-07-19 DIAGNOSIS — M9901 Segmental and somatic dysfunction of cervical region: Secondary | ICD-10-CM | POA: Diagnosis not present

## 2017-07-19 DIAGNOSIS — M546 Pain in thoracic spine: Secondary | ICD-10-CM | POA: Diagnosis not present

## 2017-07-19 DIAGNOSIS — M9902 Segmental and somatic dysfunction of thoracic region: Secondary | ICD-10-CM | POA: Diagnosis not present

## 2017-08-16 DIAGNOSIS — M9901 Segmental and somatic dysfunction of cervical region: Secondary | ICD-10-CM | POA: Diagnosis not present

## 2017-08-16 DIAGNOSIS — M542 Cervicalgia: Secondary | ICD-10-CM | POA: Diagnosis not present

## 2017-08-16 DIAGNOSIS — M9902 Segmental and somatic dysfunction of thoracic region: Secondary | ICD-10-CM | POA: Diagnosis not present

## 2017-08-16 DIAGNOSIS — M546 Pain in thoracic spine: Secondary | ICD-10-CM | POA: Diagnosis not present

## 2017-10-04 DIAGNOSIS — M542 Cervicalgia: Secondary | ICD-10-CM | POA: Diagnosis not present

## 2017-10-04 DIAGNOSIS — M546 Pain in thoracic spine: Secondary | ICD-10-CM | POA: Diagnosis not present

## 2017-10-04 DIAGNOSIS — M9902 Segmental and somatic dysfunction of thoracic region: Secondary | ICD-10-CM | POA: Diagnosis not present

## 2017-10-04 DIAGNOSIS — M9901 Segmental and somatic dysfunction of cervical region: Secondary | ICD-10-CM | POA: Diagnosis not present

## 2017-11-01 DIAGNOSIS — M9901 Segmental and somatic dysfunction of cervical region: Secondary | ICD-10-CM | POA: Diagnosis not present

## 2017-11-01 DIAGNOSIS — M542 Cervicalgia: Secondary | ICD-10-CM | POA: Diagnosis not present

## 2017-11-01 DIAGNOSIS — M9902 Segmental and somatic dysfunction of thoracic region: Secondary | ICD-10-CM | POA: Diagnosis not present

## 2017-11-01 DIAGNOSIS — M546 Pain in thoracic spine: Secondary | ICD-10-CM | POA: Diagnosis not present

## 2017-12-06 DIAGNOSIS — M9902 Segmental and somatic dysfunction of thoracic region: Secondary | ICD-10-CM | POA: Diagnosis not present

## 2017-12-06 DIAGNOSIS — M542 Cervicalgia: Secondary | ICD-10-CM | POA: Diagnosis not present

## 2017-12-06 DIAGNOSIS — M9901 Segmental and somatic dysfunction of cervical region: Secondary | ICD-10-CM | POA: Diagnosis not present

## 2017-12-06 DIAGNOSIS — M546 Pain in thoracic spine: Secondary | ICD-10-CM | POA: Diagnosis not present

## 2018-01-24 DIAGNOSIS — M9904 Segmental and somatic dysfunction of sacral region: Secondary | ICD-10-CM | POA: Diagnosis not present

## 2018-01-24 DIAGNOSIS — M545 Low back pain: Secondary | ICD-10-CM | POA: Diagnosis not present

## 2018-01-24 DIAGNOSIS — M9905 Segmental and somatic dysfunction of pelvic region: Secondary | ICD-10-CM | POA: Diagnosis not present

## 2018-01-24 DIAGNOSIS — M9903 Segmental and somatic dysfunction of lumbar region: Secondary | ICD-10-CM | POA: Diagnosis not present

## 2018-03-07 DIAGNOSIS — M9901 Segmental and somatic dysfunction of cervical region: Secondary | ICD-10-CM | POA: Diagnosis not present

## 2018-03-07 DIAGNOSIS — M542 Cervicalgia: Secondary | ICD-10-CM | POA: Diagnosis not present

## 2018-03-07 DIAGNOSIS — M9902 Segmental and somatic dysfunction of thoracic region: Secondary | ICD-10-CM | POA: Diagnosis not present

## 2018-03-07 DIAGNOSIS — M546 Pain in thoracic spine: Secondary | ICD-10-CM | POA: Diagnosis not present

## 2018-04-04 DIAGNOSIS — M9902 Segmental and somatic dysfunction of thoracic region: Secondary | ICD-10-CM | POA: Diagnosis not present

## 2018-04-04 DIAGNOSIS — M542 Cervicalgia: Secondary | ICD-10-CM | POA: Diagnosis not present

## 2018-04-04 DIAGNOSIS — M546 Pain in thoracic spine: Secondary | ICD-10-CM | POA: Diagnosis not present

## 2018-04-04 DIAGNOSIS — M9901 Segmental and somatic dysfunction of cervical region: Secondary | ICD-10-CM | POA: Diagnosis not present

## 2018-05-02 DIAGNOSIS — M546 Pain in thoracic spine: Secondary | ICD-10-CM | POA: Diagnosis not present

## 2018-05-02 DIAGNOSIS — M9901 Segmental and somatic dysfunction of cervical region: Secondary | ICD-10-CM | POA: Diagnosis not present

## 2018-05-02 DIAGNOSIS — M9902 Segmental and somatic dysfunction of thoracic region: Secondary | ICD-10-CM | POA: Diagnosis not present

## 2018-05-02 DIAGNOSIS — M542 Cervicalgia: Secondary | ICD-10-CM | POA: Diagnosis not present

## 2018-05-21 ENCOUNTER — Ambulatory Visit (INDEPENDENT_AMBULATORY_CARE_PROVIDER_SITE_OTHER): Payer: BLUE CROSS/BLUE SHIELD

## 2018-05-21 ENCOUNTER — Encounter: Payer: Self-pay | Admitting: Family Medicine

## 2018-05-21 ENCOUNTER — Ambulatory Visit: Payer: BLUE CROSS/BLUE SHIELD | Admitting: Family Medicine

## 2018-05-21 ENCOUNTER — Ambulatory Visit: Payer: Self-pay | Admitting: Family Medicine

## 2018-05-21 VITALS — BP 130/69 | HR 70 | Temp 98.3°F | Resp 20 | Ht 76.0 in | Wt 202.6 lb

## 2018-05-21 DIAGNOSIS — Z23 Encounter for immunization: Secondary | ICD-10-CM | POA: Diagnosis not present

## 2018-05-21 DIAGNOSIS — M940 Chondrocostal junction syndrome [Tietze]: Secondary | ICD-10-CM | POA: Insufficient documentation

## 2018-05-21 DIAGNOSIS — R079 Chest pain, unspecified: Secondary | ICD-10-CM | POA: Diagnosis not present

## 2018-05-21 HISTORY — DX: Chondrocostal junction syndrome (tietze): M94.0

## 2018-05-21 MED ORDER — NAPROXEN 500 MG PO TABS: 500.0000 mg | ORAL_TABLET | Freq: Two times a day (BID) | ORAL | 0 refills | Status: DC

## 2018-05-21 NOTE — Patient Instructions (Signed)
Avoid mountain biking for at least a week.  Take naproxen every 12 hours with food for 5-7 days.  Stretch daily.   Please have chest xray at [facility] today.    Followup in 2 weeks if not improved    Costochondritis Costochondritis is swelling and irritation (inflammation) of the tissue (cartilage) that connects your ribs to your breastbone (sternum). This causes pain in the front of your chest. The pain usually starts gradually and involves more than one rib. What are the causes? The exact cause of this condition is not always known. It results from stress on the cartilage where your ribs attach to your sternum. The cause of this stress could be:  Chest injury (trauma).  Exercise or activity, such as lifting.  Severe coughing.  What increases the risk? You may be at higher risk for this condition if you:  Are male.  Are 30?31 years old.  Recently started a new exercise or work activity.  Have low levels of vitamin D.  Have a condition that makes you cough frequently.  What are the signs or symptoms? The main symptom of this condition is chest pain. The pain:  Usually starts gradually and can be sharp or dull.  Gets worse with deep breathing, coughing, or exercise.  Gets better with rest.  May be worse when you press on the sternum-rib connection (tenderness).  How is this diagnosed? This condition is diagnosed based on your symptoms, medical history, and a physical exam. Your health care provider will check for tenderness when pressing on your sternum. This is the most important finding. You may also have tests to rule out other causes of chest pain. These may include:  A chest X-ray to check for lung problems.  An electrocardiogram (ECG) to see if you have a heart problem that could be causing the pain.  An imaging scan to rule out a chest or rib fracture.  How is this treated? This condition usually goes away on its own over time. Your health care  provider may prescribe an NSAID to reduce pain and inflammation. Your health care provider may also suggest that you:  Rest and avoid activities that make pain worse.  Apply heat or cold to the area to reduce pain and inflammation.  Do exercises to stretch your chest muscles.  If these treatments do not help, your health care provider may inject a numbing medicine at the sternum-rib connection to help relieve the pain. Follow these instructions at home:  Avoid activities that make pain worse. This includes any activities that use chest, abdominal, and side muscles.  If directed, put ice on the painful area: ? Put ice in a plastic bag. ? Place a towel between your skin and the bag. ? Leave the ice on for 20 minutes, 2-3 times a day.  If directed, apply heat to the affected area as often as told by your health care provider. Use the heat source that your health care provider recommends, such as a moist heat pack or a heating pad. ? Place a towel between your skin and the heat source. ? Leave the heat on for 20-30 minutes. ? Remove the heat if your skin turns bright red. This is especially important if you are unable to feel pain, heat, or cold. You may have a greater risk of getting burned.  Take over-the-counter and prescription medicines only as told by your health care provider.  Return to your normal activities as told by your health care provider.  Ask your health care provider what activities are safe for you.  Keep all follow-up visits as told by your health care provider. This is important. Contact a health care provider if:  You have chills or a fever.  Your pain does not go away or it gets worse.  You have a cough that does not go away (is persistent). Get help right away if:  You have shortness of breath. This information is not intended to replace advice given to you by your health care provider. Make sure you discuss any questions you have with your health care  provider. Document Released: 06/07/2005 Document Revised: 03/17/2016 Document Reviewed: 12/22/2015 Elsevier Interactive Patient Education  [employer].

## 2018-05-21 NOTE — Telephone Encounter (Signed)
Pt called to report chest pain that started 1 month ago. Pt states that pt is constant  Is moderate in severity. Pt stated that pain is located to mid upper chest. Pt stated the pain is worse in the morning but eases up as the day goes on. Pain worsens with physical activity and exercising like with push ups. Pt denies shortness of breath, sweating, radiation. Pt stated that he was using vape pen for the last 6 months and stopped using vape pen when the chest pain began.  Care advice given and appt given for today with PCP at 10:30 am.  Reason for Disposition . [1] Chest pain(s) lasting a few seconds AND [2] persists > 3 days  Answer Assessment - Initial Assessment Questions 1. LOCATION: "Where does it hurt?"       Sternum or center of chest 2. RADIATION: "Does the pain go anywhere else?" (e.g., into neck, jaw, arms, back)     no 3. ONSET: "When did the chest pain begin?" (Minutes, hours or days)      1 month ago 4. PATTERN "Does the pain come and go, or has it been constant since it started?"  "Does it get worse with exertion?"      Comes and goes worse when awakes up in the morning and eases up during the day-Worsens with activity that exacerbates the problem 5. DURATION: "How long does it last" (e.g., seconds, minutes, hours)     present all the time worse with physical activity like push ups 6. SEVERITY: "How bad is the pain?"  (e.g., Scale 1-10; mild, moderate, or severe)    - MILD (1-3): doesn't interfere with normal activities     - MODERATE (4-7): interferes with normal activities or awakens from sleep    - SEVERE (8-10): excruciating pain, unable to do any normal activities       moderate 7. CARDIAC RISK FACTORS: "Do you have any history of heart problems or risk factors for heart disease?" (e.g., prior heart attack, angina; high blood pressure, diabetes, being overweight, high cholesterol, smoking, or strong family history of heart disease)     High cholesterol and high blood  pressure, uses vape pens but has stopped  8. PULMONARY RISK FACTORS: "Do you have any history of lung disease?"  (e.g., blood clots in lung, asthma, emphysema, birth control pills)     no 9. CAUSE: "What do you think is causing the chest pain?"     vape pen 10. OTHER SYMPTOMS: "Do you have any other symptoms?" (e.g., dizziness, nausea, vomiting, sweating, fever, difficulty breathing, cough)    no 11. PREGNANCY: "Is there any chance you are pregnant?" "When was your last menstrual period?"       n/a  Protocols used: CHEST PAIN-A-AH

## 2018-05-21 NOTE — Progress Notes (Signed)
Paul [name] , 11-Sep-1986, 32 y.o., [name] MRN: [id number] Patient Care Team    Relationship Specialty Notifications Start End  Natalia Leatherwood, DO PCP - General Family Medicine  05/04/15     Chief Complaint  Patient presents with  . Chest Pain    pain with activity x 1 month     Subjective: Pt presents for an OV with complaints of chest discomfort of 1 month duration.  Associated symptoms include pain with flexing pectorals or preforming push ups. He states the discomfort is worse in the morning and better towards the end of the day. He does not recall a injury or increase in activity that may have started the pain. He reports stretching helps. When stretching his chest/arms yesterday he felt and heard a pop near the center of his chest. He points to the sternum as location of pain. He reports the pain is not bad today. He has been mountain biking. He has concerns his symptoms are from vaping. He states he started vaping a few months ago, he has since stopped. His father had lung cancer.   Pt has tried nothing to ease their symptoms.   Depression screen [facility] 2/9 05/21/2018 05/08/2016  Decreased Interest 0 0  Down, Depressed, Hopeless 0 0  PHQ - 2 Score 0 0    No Known Allergies Social History   Tobacco Use  . Smoking status: Never Smoker  . Smokeless tobacco: Never Used  Substance Use Topics  . Alcohol use: Yes    Comment: 3x a week.    Past Medical History:  Diagnosis Date  . Arthritis   . Back pain    03/17/12 xray: minimal compression deformity L1. 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  . Chickenpox    childhood   . Heart murmur    present since childhood, normal echo 2002  . Tinea versicolor    History reviewed. No pertinent surgical history. Family History  Problem Relation Age of Onset  . Alcohol abuse Mother   . Lung cancer Father 6       nonsmoker  . Breast cancer Maternal Grandmother 40   Allergies as of 05/21/2018   No Known  Allergies     Medication List    as of 05/21/2018 10:51 AM   You have not been prescribed any medications.     All past medical history, surgical history, allergies, family history, immunizations andmedications were updated in the EMR today and reviewed under the history and medication portions of their EMR.     ROS: Negative, with the exception of above mentioned in HPI   Objective:  BP 130/69 (BP Location: Left Arm, Patient Position: Sitting, Cuff Size: Large)   Pulse 70   Temp 98.3 F (36.8 C)   Resp 20   Ht 6\' 4"  (1.93 m)   Wt 202 lb 9.6 oz (91.9 kg)   SpO2 100%   BMI 24.66 kg/m  Body mass index is 24.66 kg/m. Gen: Afebrile. No acute distress. Nontoxic in appearance, well developed, well nourished.  HENT: AT. [place]. MMM Eyes:Pupils Equal Round Reactive to light, Extraocular movements intact,  Conjunctiva without redness, discharge or icterus. Neck/lymp/endocrine: Supple,no lymphadenopathy CV: RRR, no edema. No reproducible chest wall tenderness  Chest: CTAB, no wheeze or crackles. Good air movement, normal resp effort.  MSK: no erythema, No TTP chest wall, sternum or rib head. FROM bilateral arms. NV intact distally.  Skin: no rashes, purpura or petechiae.  Neuro:  Normal gait. PERLA.  EOMi. Alert. Oriented x3  No exam data present No results found. No results found for this or any previous visit (from the past 24 hour(s)).  Assessment/Plan: Paul [name] is a 32 y.o. [name] present for OV for  Immunization due - Flu Vaccine QUAD 6+ mos PF IM (Fluarix Quad PF Chest pain, unspecified type - > 4 weeks chest discomfort which seems to be MSK related. Discussed costochondritis and rib head dysfunction/subluxation.  - rest (no mtn biking for at least 1 week), NSAIDS--> naproxen BID prescribed x5 -7d --> stretches daily for arms and chest.  - He is concerned vaping has caused some of his sx. CXR ordered today for lung eval and rib head sternum anatomy. He has been mountain  biking, which may have caused injury, that is resulting in his slow recovery.  - he has stopped vaping. Strongly encouraged to not restart - consider OMT referral if not imrpoved in 2 weeks - DG Chest 2 View; Future - f/u 2 weeks if not resolving   Reviewed expectations re: course of current medical issues.  Discussed self-management of symptoms.  Outlined signs and symptoms indicating need for more acute intervention.  Patient verbalized understanding and all questions were answered.  Patient received an After-Visit Summary.    Orders Placed This Encounter  Procedures  . DG Chest 2 View  . Flu Vaccine QUAD 6+ mos PF IM (Fluarix Quad PF)     Note is dictated utilizing voice recognition software. Although note has been proof read prior to signing, occasional typographical errors still can be missed. If any questions arise, please do not hesitate to call for verification.   electronically signed by:  Felix Pacini, DO  [facility] - [place]

## 2018-05-22 ENCOUNTER — Telehealth: Payer: Self-pay | Admitting: Family Medicine

## 2018-05-22 NOTE — Telephone Encounter (Signed)
Patient was notified see result note

## 2018-05-22 NOTE — Telephone Encounter (Signed)
Copied from CRM [id number]. Topic: Quick Communication - Other Results >> May 22, 2018 10:20 AM Stephannie Li, NT wrote: Patient called and would like a call with his x ray results from yesterday please call [phone]

## 2018-05-30 DIAGNOSIS — M542 Cervicalgia: Secondary | ICD-10-CM | POA: Diagnosis not present

## 2018-05-30 DIAGNOSIS — M9901 Segmental and somatic dysfunction of cervical region: Secondary | ICD-10-CM | POA: Diagnosis not present

## 2018-05-30 DIAGNOSIS — M9902 Segmental and somatic dysfunction of thoracic region: Secondary | ICD-10-CM | POA: Diagnosis not present

## 2018-05-30 DIAGNOSIS — M546 Pain in thoracic spine: Secondary | ICD-10-CM | POA: Diagnosis not present

## 2018-06-05 ENCOUNTER — Ambulatory Visit: Payer: BLUE CROSS/BLUE SHIELD | Admitting: Family Medicine

## 2018-06-06 ENCOUNTER — Telehealth: Payer: Self-pay | Admitting: *Deleted

## 2018-06-06 NOTE — Telephone Encounter (Signed)
Called patient back got voice mail left message for patient after review of Dr Blenda Bridegroom note patient was instructed to take Naproxen for 5-7 days only and to follow up with her in 2 weeks if pain persisted. Patient will need to schedule an office visit with Dr Claiborne Billings for further evaluation. No refill provided at this time.

## 2018-06-06 NOTE — Telephone Encounter (Signed)
Copied from CRM [id number]. Topic: General - Other >> Jun 06, 2018 12:53 PM Mcneil, Ja-Kwan wrote: Reason for CRM: Pt states he was prescribed a medication for his chest and he would like it refilled. Pt requests call back from Dr Alan Ripper nurse. Cb# [phone]

## 2018-06-11 ENCOUNTER — Ambulatory Visit: Payer: BLUE CROSS/BLUE SHIELD | Admitting: Family Medicine

## 2018-06-19 ENCOUNTER — Ambulatory Visit: Payer: BLUE CROSS/BLUE SHIELD | Admitting: Family Medicine

## 2018-06-19 ENCOUNTER — Telehealth: Payer: Self-pay | Admitting: Family Medicine

## 2018-06-19 ENCOUNTER — Encounter: Payer: Self-pay | Admitting: Family Medicine

## 2018-06-19 VITALS — BP 124/70 | HR 63 | Temp 98.7°F | Resp 20 | Ht 76.0 in | Wt 204.0 lb

## 2018-06-19 DIAGNOSIS — M940 Chondrocostal junction syndrome [Tietze]: Secondary | ICD-10-CM | POA: Diagnosis not present

## 2018-06-19 DIAGNOSIS — M9908 Segmental and somatic dysfunction of rib cage: Secondary | ICD-10-CM

## 2018-06-19 MED ORDER — MELOXICAM 15 MG PO TABS: 15.0000 mg | ORAL_TABLET | Freq: Every day | ORAL | 0 refills | Status: DC

## 2018-06-19 NOTE — Telephone Encounter (Signed)
I have scheduled this patient for the end of the month due to patient going out of town.  If patient can be worked in with in the next week or two please follow up in regard.   Thanks!

## 2018-06-19 NOTE — Patient Instructions (Signed)
Start mobic daily with food. Follow with Dr. Katrinka Blazing for sport meds  Costochondritis Costochondritis is swelling and irritation (inflammation) of the tissue (cartilage) that connects your ribs to your breastbone (sternum). This causes pain in the front of your chest. Usually, the pain:  Starts gradually.  Is in more than one rib.  This condition usually goes away on its own over time. Follow these instructions at home:  Do not do anything that makes your pain worse.  If directed, put ice on the painful area: ? Put ice in a plastic bag. ? Place a towel between your skin and the bag. ? Leave the ice on for 20 minutes, 2-3 times a day.  If directed, put heat on the affected area as often as told by your doctor. Use the heat source that your doctor tells you to use, such as a moist heat pack or a heating pad. ? Place a towel between your skin and the heat source. ? Leave the heat on for 20-30 minutes. ? Take off the heat if your skin turns bright red. This is very important if you cannot feel pain, heat, or cold. You may have a greater risk of getting burned.  Take over-the-counter and prescription medicines only as told by your doctor.  Return to your normal activities as told by your doctor. Ask your doctor what activities are safe for you.  Keep all follow-up visits as told by your doctor. This is important. Contact a doctor if:  You have chills or a fever.  Your pain does not go away or it gets worse.  You have a cough that does not go away. Get help right away if:  You are short of breath. This information is not intended to replace advice given to you by your health care provider. Make sure you discuss any questions you have with your health care provider. Document Released: 02/14/2008 Document Revised: 03/17/2016 Document Reviewed: 12/22/2015 Elsevier Interactive Patient Education  [employer].

## 2018-06-19 NOTE — Progress Notes (Signed)
Paul [name] , 1985/09/23, 32 y.o., male MRN: [id number] Patient Care Team    Relationship Specialty Notifications Start End  Natalia Leatherwood, DO PCP - General Family Medicine  05/04/15     Chief Complaint  Patient presents with  . Follow-up    chest pain sternal area with movement     Subjective: Paul [name] is a 32 y.o. male presents today for follow up Costochondritis/Somatic dysfunction of rib He reports he took the naproxen during week one and he saw improvement. He rested from mountain biking as well. However, when he went golfing he felt pain points left of midline sternum and felt a pop again. He has also returned to mountain biking and has noticed more tenderness again, but it does not bother him during mountain biking.   Prior note:  Pt presents for an OV with complaints of chest discomfort of 1 month duration.  Associated symptoms include pain with flexing pectorals or preforming push ups. He states the discomfort is worse in the morning and better towards the end of the day. He does not recall a injury or increase in activity that may have started the pain. He reports stretching helps. When stretching his chest/arms yesterday he felt and heard a pop near the center of his chest. He points to the sternum as location of pain. He reports the pain is not bad today. He has been mountain biking. He has concerns his symptoms are from vaping. He states he started vaping a few months ago, he has since stopped. His father had lung cancer.   Pt has tried nothing to ease their symptoms.   Depression screen [facility] 2/9 05/21/2018 05/08/2016  Decreased Interest 0 0  Down, Depressed, Hopeless 0 0  PHQ - 2 Score 0 0    No Known Allergies Social History   Tobacco Use  . Smoking status: Never Smoker  . Smokeless tobacco: Never Used  Substance Use Topics  . Alcohol use: Yes    Comment: 3x a week.    Past Medical History:  Diagnosis Date  . Anxiety   . Arthritis   . Back pain    03/17/12 xray: minimal compression deformity L1. 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  . Chickenpox    childhood   . Heart murmur    present since childhood, normal echo 2002  . IBS (irritable bowel syndrome) 2017   bloody stool x1  . Insomnia 2017  . Psoriasis of scalp    History reviewed. No pertinent surgical history. Family History  Problem Relation Age of Onset  . Alcohol abuse Mother   . Lung cancer Father 63       nonsmoker  . Breast cancer Maternal Grandmother 40   Allergies as of 06/19/2018   No Known Allergies     Medication List    as of 06/19/2018  9:21 AM   You have not been prescribed any medications.     All past medical history, surgical history, allergies, family history, immunizations andmedications were updated in the EMR today and reviewed under the history and medication portions of their EMR.     ROS: Negative, with the exception of above mentioned in HPI   Objective:  BP 124/70 (BP Location: Left Arm, Patient Position: Sitting, Cuff Size: Large)   Pulse 63   Temp 98.7 F (37.1 C)   Resp 20   Ht 6\' 4"  (1.93 m)   Wt 204 lb (92.5 kg)   SpO2 98%  BMI 24.83 kg/m  Body mass index is 24.83 kg/m.  Gen: Afebrile. No acute distress.  HENT: AT. [place] MMM.  MSK: no erythema, no soft tissue swelling, Mild prominence of left 3 rd rib. TTP left 3rd rib head at sternum. Full ROM bilateral arms. NV intact distally.  Skin: no bruising, rashes, purpura or petechiae.    No exam data present No results found. No results found for this or any previous visit (from the past 24 hour(s)).  Assessment/Plan: Paul [name] is a 32 y.o. male present for OV for  Costochondritis/Somatic dysfunction of rib Suspect costochondritis and rib head sublux. Had improvement with NSAIDS and rest, but symptoms returned with activity.  - CXR was normal.  - discussed SM referral for eval and potential OMT; and his agreeable to this today.  - start daily  mobic for now, until able to see DO - Ambulatory referral to Sports Medicine    Reviewed expectations re: course of current medical issues.  Discussed self-management of symptoms.  Outlined signs and symptoms indicating need for more acute intervention.  Patient verbalized understanding and all questions were answered.  Patient received an After-Visit Summary.    No orders of the defined types were placed in this encounter.    Note is dictated utilizing voice recognition software. Although note has been proof read prior to signing, occasional typographical errors still can be missed. If any questions arise, please do not hesitate to call for verification.   electronically signed by:  Felix Pacini, DO   [facility] - [place]

## 2018-06-20 NOTE — Telephone Encounter (Signed)
Spoke to pt, rescheduled him for 06/26/18.

## 2018-06-24 ENCOUNTER — Ambulatory Visit: Payer: BLUE CROSS/BLUE SHIELD | Admitting: Family Medicine

## 2018-06-25 NOTE — Progress Notes (Signed)
[facility] [address] [place], [place] [address] Phone: [phone] Subjective:   [name], am serving as a scribe for Dr. Antoine Primas.  I'm seeing this patient by the request  of:  Kuneff, Renee A, DO   CC: Chest pain  [id number]  Paul [name] is a 32 y.o. male coming in with complaint of chest pain. He has had pain for months. Pain increased over 1 month ago. Pain in sternum. Pain is intermittent but was constant and he had trouble breathing. He does feel that his activities made his pain worse. Has decreased his physical activity. Is using meloxicam for pain.  Patient saw primary care provider.  Was diagnosed with costochondritis.  Did respond very well to anti-inflammatories.  Patient states that now at this moment it does not seem to be very painful at all.  Very mild discomfort from time to time but has been able to ride his mountain bike without any significant discomfort.  No radiation of the arm, denies any significant neck pain.  Once again does not remember any true injury.  Denies any shortness of breath or any recent illnesses.       Past Medical History:  Diagnosis Date  . Anxiety   . Arthritis   . Back pain    03/17/12 xray: minimal compression deformity L1. 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  . Chickenpox    childhood   . Heart murmur    present since childhood, normal echo 2002  . IBS (irritable bowel syndrome) 2017   bloody stool x1  . Insomnia 2017  . Psoriasis of scalp    No past surgical history on file. Social History   Socioeconomic History  . Marital status: Single    Spouse name: Not on file  . Number of children: Not on file  . Years of education: Not on file  . Highest education level: Not on file  Occupational History  . Not on file  Social Needs  . Financial resource strain: Not on file  . Food insecurity:    Worry: Not on file    Inability: Not on file  .  Transportation needs:    Medical: Not on file    Non-medical: Not on file  Tobacco Use  . Smoking status: Never Smoker  . Smokeless tobacco: Never Used  Substance and Sexual Activity  . Alcohol use: Yes    Comment: 3x a week.   . Drug use: No  . Sexual activity: Yes    Birth control/protection: None  Lifestyle  . Physical activity:    Days per week: Not on file    Minutes per session: Not on file  . Stress: Not on file  Relationships  . Social connections:    Talks on phone: Not on file    Gets together: Not on file    Attends religious service: Not on file    Active member of club or organization: Not on file    Attends meetings of clubs or organizations: Not on file    Relationship status: Not on file  Other Topics Concern  . Not on file  Social History Narrative   Recently moved from [place]. Engaged to [employer]. Wedding Date 01/2016. 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 1 dog. Wears his seatbelt. Wears a bike helmet. Exercises more than 3 times a week. Has a smoke alarm the home. Has firearms in the home.  No Known Allergies Family History  Problem Relation Age of Onset  . Alcohol abuse Mother   . Lung cancer Father 77       nonsmoker  . Breast cancer Maternal Grandmother 40       Current Outpatient Medications (Analgesics):  .  meloxicam (MOBIC) 15 MG tablet, Take 1 tablet (15 mg total) by mouth daily.      Past medical history, social, surgical and family history all reviewed in electronic medical record.  No pertanent information unless stated regarding to the chief complaint.   Review of Systems:  No headache, visual changes, nausea, vomiting, diarrhea, constipation, dizziness, abdominal pain, skin rash, fevers, chills, night sweats, weight loss, swollen lymph nodes, body aches, joint swelling, muscle aches, chest pain, shortness of breath, mood changes.   Objective  There were no vitals taken for this visit.    General: No  apparent distress alert and oriented x3 mood and affect normal, dressed appropriately.  HEENT: Pupils equal, extraocular movements intact  Respiratory: Patient's speak in full sentences and does not appear short of breath  Cardiovascular: No lower extremity edema, non tender, no erythema patient though does have an elongated sternum down to the T11 area. Skin: Warm dry intact with no signs of infection or rash on extremities or on axial skeleton.  Abdomen: Soft nontender  Neuro: Cranial nerves II through XII are intact, neurovascularly intact in all extremities with 2+ DTRs and 2+ pulses.  Lymph: No lymphadenopathy of posterior or anterior cervical chain or axillae bilaterally.  Gait normal with good balance and coordination.  MSK:  Non tender with full range of motion and good stability and symmetric strength and tone of  elbows, wrist, hip, knee and ankles bilaterally.  Shoulder: Bilaterally Inspection reveals no abnormalities, atrophy or asymmetry. Palpation is normal with no tenderness over AC joint or bicipital groove. ROM is full in all planes. Rotator cuff strength normal throughout. No signs of impingement with negative Neer and Hawkin's tests, empty can sign. Speeds and Yergason's tests normal. No labral pathology noted with negative Obrien's, negative clunk and good stability. \Mild scapular dyskinesis No painful arc and no drop arm sign. No apprehension sign  Osteopathic findings  C6 flexed rotated and side bent left T2 extended rotated and side bent left inhaled rib    Impression and Recommendations:     This case required medical decision making of moderate complexity. The above documentation has been reviewed and is accurate and complete Judi Saa, DO       Note: This dictation was prepared with Dragon dictation along with smaller phrase technology. Any transcriptional errors that result from this process are unintentional.

## 2018-06-26 ENCOUNTER — Encounter: Payer: Self-pay | Admitting: Family Medicine

## 2018-06-26 ENCOUNTER — Ambulatory Visit: Payer: BLUE CROSS/BLUE SHIELD | Admitting: Family Medicine

## 2018-06-26 DIAGNOSIS — M940 Chondrocostal junction syndrome [Tietze]: Secondary | ICD-10-CM

## 2018-06-26 DIAGNOSIS — M9908 Segmental and somatic dysfunction of rib cage: Secondary | ICD-10-CM

## 2018-06-26 NOTE — Patient Instructions (Signed)
Good to see you  Note for a standing desk  Exercises 3 times a week.  Tried manipulation but not bad overall  Posture will be key  See me again in 6-8 weeks if you need me

## 2018-06-26 NOTE — Assessment & Plan Note (Signed)
Decision today to treat with OMT was based on Physical Exam  After verbal consent patient was treated with HVLA, ME, FPR techniques in cervical, thoracic, rib areas  Patient tolerated the procedure well with improvement in symptoms  Patient given exercises, stretches and lifestyle modifications  See medications in patient instructions if given  Patient will follow up in 4-6 weeks 

## 2018-06-26 NOTE — Assessment & Plan Note (Signed)
Resolved fairly well at this time.  I do not notice any significant inflammation.  Patient given a topical anti-inflammatories.  Some mild exercises for more of the scapular dyskinesis.  Exercises given.  Discussed posture and ergonomics.  Discussed working environment.  Discussed over-the-counter medications that could be beneficial.  Follow-up again in 4 to 6 weeks

## 2018-06-27 DIAGNOSIS — M542 Cervicalgia: Secondary | ICD-10-CM | POA: Diagnosis not present

## 2018-06-27 DIAGNOSIS — M9901 Segmental and somatic dysfunction of cervical region: Secondary | ICD-10-CM | POA: Diagnosis not present

## 2018-06-27 DIAGNOSIS — M546 Pain in thoracic spine: Secondary | ICD-10-CM | POA: Diagnosis not present

## 2018-06-27 DIAGNOSIS — M9902 Segmental and somatic dysfunction of thoracic region: Secondary | ICD-10-CM | POA: Diagnosis not present

## 2018-07-10 ENCOUNTER — Ambulatory Visit: Payer: BLUE CROSS/BLUE SHIELD | Admitting: Family Medicine

## 2018-07-16 ENCOUNTER — Encounter: Payer: Self-pay | Admitting: *Deleted

## 2018-08-06 DIAGNOSIS — M542 Cervicalgia: Secondary | ICD-10-CM | POA: Diagnosis not present

## 2018-08-06 DIAGNOSIS — M9901 Segmental and somatic dysfunction of cervical region: Secondary | ICD-10-CM | POA: Diagnosis not present

## 2018-08-06 DIAGNOSIS — M546 Pain in thoracic spine: Secondary | ICD-10-CM | POA: Diagnosis not present

## 2018-08-06 DIAGNOSIS — M9902 Segmental and somatic dysfunction of thoracic region: Secondary | ICD-10-CM | POA: Diagnosis not present

## 2018-08-13 ENCOUNTER — Ambulatory Visit: Payer: BLUE CROSS/BLUE SHIELD | Admitting: Family Medicine

## 2018-08-20 ENCOUNTER — Telehealth: Payer: Self-pay

## 2018-08-20 NOTE — Telephone Encounter (Signed)
Message left for patient to return call to schedule an appt if needing refill on Meloxicam. Per last visit in oct.

## 2018-09-17 ENCOUNTER — Encounter: Payer: Self-pay | Admitting: Family Medicine

## 2018-09-17 ENCOUNTER — Ambulatory Visit: Payer: BLUE CROSS/BLUE SHIELD | Admitting: Family Medicine

## 2018-09-17 VITALS — BP 130/77 | HR 54 | Temp 98.1°F | Wt 207.4 lb

## 2018-09-17 DIAGNOSIS — Z801 Family history of malignant neoplasm of trachea, bronchus and lung: Secondary | ICD-10-CM | POA: Diagnosis not present

## 2018-09-17 DIAGNOSIS — Z8041 Family history of malignant neoplasm of ovary: Secondary | ICD-10-CM | POA: Diagnosis not present

## 2018-09-17 DIAGNOSIS — Z803 Family history of malignant neoplasm of breast: Secondary | ICD-10-CM

## 2018-09-17 DIAGNOSIS — Z800 Family history of malignant neoplasm of digestive organs: Secondary | ICD-10-CM | POA: Diagnosis not present

## 2018-09-17 DIAGNOSIS — Z1379 Encounter for other screening for genetic and chromosomal anomalies: Secondary | ICD-10-CM

## 2018-09-17 NOTE — Patient Instructions (Addendum)
Genetic counselor will call you to schedule appt to discuss testing and recommendations.    Lynch Syndrome Lynch syndrome, also called hereditary nonpolyposis colorectal cancer (HNPCC), is a condition that increases a person's risk for developing colorectal cancer before age 32. Lynch syndrome can also increase a person's risk for many other types of cancer, including stomach, small intestine, liver, gallbladder, pancreas, urinary tract, skin, and brain cancers. Women with this condition have a higher risk for developing cancer of the ovaries and cancer in the lining of the uterus (endometrium). What are the causes? This condition is caused by a gene mutation that is inherited from one or both parents. A gene mutation is a harmful change in a gene. Not everyone who inherits the genetic mutation develops cancer. What are the signs or symptoms? There are no symptoms of this condition. However, your health care provider may test you for Lynch syndrome if you:  Have colorectal cancer before age 51.  Have family members diagnosed with colorectal, endometrial, or other types of cancer. How is this diagnosed? This condition is diagnosed with:  A review of your family history of cancer.  A blood test to look for the mutations that cause this condition.  Testing of tumor tissue (biopsy). How is this treated? This condition may be managed with:  Genetic counseling to assess your risk and your options for management.  Regular screening tests for the associated cancers. You may need to have a colonoscopy every 1-2 years starting at an early age.  Daily aspirin therapy.  Preventive surgery to remove sites where cancer can develop, such as the colon, uterus, and ovaries. Follow these instructions at home:   Ask your health care provider about your risks.  Discuss a referral for genetic counseling. Ask about the risks and benefits of genetic counseling.  Write down any questions you have about  your condition.  Follow your plan for cancer screenings as told by your health care provider.  Take over-the-counter and prescription medicines only as told by your health care provider.  Maintain a healthy diet.  Consider joining a support group. This may help you learn to cope with the stress of having Lynch syndrome.  Keep all follow-up visits as told by your health care provider. This is important. Contact a health care provider if:  You develop any new or unusual symptoms.  You develop symptoms of colorectal cancer, such as: ? Blood in the stool. ? Changes in bowel habits. ? Abdominal pain or bloating. ? Unexplained weight loss. Summary  Lynch syndrome is caused by an inherited gene mutation. Not everyone who inherits this mutation will develop cancer.  Genetic counseling and blood testing for Lynch syndrome can identify people with the condition.  Regular cancer screening tests are important in managing this condition. This information is not intended to replace advice given to you by your health care provider. Make sure you discuss any questions you have with your health care provider. Document Released: 04/21/2016 Document Revised: 04/27/2016 Document Reviewed: 04/21/2016 Elsevier Interactive Patient Education  [employer].

## 2018-09-17 NOTE — Progress Notes (Signed)
Paul [name] , 05-17-1986, 33 y.o., male MRN: [id number] Patient Care Team    Relationship Specialty Notifications Start End  Ma Hillock, DO PCP - General Family Medicine  05/04/15     Chief Complaint  Patient presents with  . genetic testing     Subjective: Pt presents for an OV to discuss positive genetic testing in his mother for Lynch Syndrome. His Maternal side of his family history include breast cancer MGM and ovarian/endometerial cancer MGGM. His mother recently tested positive for "PMS2 dup Exon 68- heterozygous" lynch/[facility].  History of rectal bleeding and chronic loose stools in patient in the past--> declined GI referral at that time (prior to knowing current family history).  Bleeding resolved and symptoms improved. Currently without symptoms. Weight stable.   Depression screen [facility] 2/9 05/21/2018 05/08/2016  Decreased Interest 0 0  Down, Depressed, Hopeless 0 0  PHQ - 2 Score 0 0    No Known Allergies Social History   Tobacco Use  . Smoking status: Never Smoker  . Smokeless tobacco: Never Used  Substance Use Topics  . Alcohol use: Yes    Comment: 3x a week.    Past Medical History:  Diagnosis Date  . Anxiety   . Arthritis   . Back pain    03/17/12 xray: minimal compression deformity L1. 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  . Chickenpox    childhood   . Heart murmur    present since childhood, normal echo 2002  . IBS (irritable bowel syndrome) 2017   bloody stool x1  . Insomnia 2017  . Psoriasis of scalp    No past surgical history on file. Family History  Problem Relation Age of Onset  . Alcohol abuse Mother   . Lung cancer Father 23       nonsmoker  . Breast cancer Maternal Grandmother 40   Allergies as of 09/17/2018   No Known Allergies     Medication List    as of September 17, 2018 11:37 AM   You have not been prescribed any medications.     All past medical history, surgical history, allergies, family  history, immunizations andmedications were updated in the EMR today and reviewed under the history and medication portions of their EMR.     ROS: Negative, with the exception of above mentioned in HPI   Objective:  BP 130/77 (BP Location: Right Arm, Patient Position: Sitting, Cuff Size: Normal)   Pulse (!) 54   Temp 98.1 F (36.7 C) (Oral)   Wt 207 lb 6.4 oz (94.1 kg)   SpO2 99%   BMI 25.25 kg/m  Body mass index is 25.25 kg/m. Gen: Afebrile. No acute distress. Nontoxic in appearance, well developed, well nourished.  Psych: Normal affect, dress and demeanor. Normal speech. Normal thought content and judgment.  No exam data present No results found. No results found for this or any previous visit (from the past 24 hour(s)).  Assessment/Plan: Paul [name] is a 33 y.o. male present for OV for  Genetic screening Family history of Lynch syndrome Family history of breast cancer Family history of lung cancer Family history of ovarian cancer reviewed mothers genetic results. Discussed LYNCH syndrome and provided him with education. Referred to genetics for counseling and testing.  If positive would strongly suggest GI referral with his past symptoms  - Ambulatory referral to Genetics - F/U PRN   Reviewed expectations re: course of current medical issues.  Discussed self-management of symptoms.  Outlined signs  and symptoms indicating need for more acute intervention.  Patient verbalized understanding and all questions were answered.  Patient received an After-Visit Summary.    No orders of the defined types were placed in this encounter.   > 15 minutes spent with patient, >50% of time spent face to face counseling    Note is dictated utilizing voice recognition software. Although note has been proof read prior to signing, occasional typographical errors still can be missed. If any questions arise, please do not hesitate to call for verification.   electronically  signed by:  Howard Pouch, DO  [place]

## 2018-09-20 ENCOUNTER — Telehealth: Payer: Self-pay | Admitting: Genetics

## 2018-09-20 NOTE — Telephone Encounter (Signed)
A genetic counseling appt has been scheduled for the pt to see Darral Dash on 1/14 at 11am. Pt aware to arrive 30 minutes early. Letter mailed.

## 2018-09-24 ENCOUNTER — Inpatient Hospital Stay: Payer: BLUE CROSS/BLUE SHIELD

## 2018-09-24 ENCOUNTER — Inpatient Hospital Stay: Payer: BLUE CROSS/BLUE SHIELD | Attending: Genetic Counselor | Admitting: Genetics

## 2018-09-24 DIAGNOSIS — Z801 Family history of malignant neoplasm of trachea, bronchus and lung: Secondary | ICD-10-CM | POA: Diagnosis not present

## 2018-09-24 DIAGNOSIS — Z803 Family history of malignant neoplasm of breast: Secondary | ICD-10-CM | POA: Diagnosis not present

## 2018-09-24 DIAGNOSIS — Z800 Family history of malignant neoplasm of digestive organs: Secondary | ICD-10-CM | POA: Diagnosis not present

## 2018-09-25 ENCOUNTER — Encounter: Payer: Self-pay | Admitting: Genetics

## 2018-09-25 DIAGNOSIS — Z800 Family history of malignant neoplasm of digestive organs: Secondary | ICD-10-CM | POA: Insufficient documentation

## 2018-09-25 DIAGNOSIS — Z803 Family history of malignant neoplasm of breast: Secondary | ICD-10-CM | POA: Insufficient documentation

## 2018-09-25 DIAGNOSIS — Z801 Family history of malignant neoplasm of trachea, bronchus and lung: Secondary | ICD-10-CM | POA: Insufficient documentation

## 2018-09-25 NOTE — Progress Notes (Signed)
REFERRING PROVIDER: Howard Pouch A, DO [address] [place], [place] [address]  PRIMARY PROVIDER:  Raoul Pitch, Renee A, DO  PRIMARY REASON FOR VISIT:  1. Family history of Lynch syndrome   2. Family history of breast cancer   3. Family history of lung cancer     HISTORY OF PRESENT ILLNESS:   Paul [name], a 33 y.o. male, was seen for a [facility] genetics consultation at the request of Dr. Raoul Pitch due to a family history of Lynch syndrome identified in his mother.  His mother had genetic testing- MyRisk panel performed in Sept 2019. This testing identified a mutation in PMS2- dup Exon 54   Paul [name] presents to clinic today to discuss the possibility of a hereditary predisposition to cancer, genetic testing, and to further clarify his future cancer risks, as well as potential cancer risks for family members.   Paul [name] is a 33 y.o. male with no personal history of cancer.    He has not had a colonoscopy at this point in time.    Past Medical History:  Diagnosis Date  . Anxiety   . Arthritis   . Back pain    03/17/12 xray: minimal compression deformity L1. 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  . Chickenpox    childhood   . Family history of breast cancer   . Family history of lung cancer   . Family history of Lynch syndrome   . Heart murmur    present since childhood, normal echo 2002  . IBS (irritable bowel syndrome) 2017   bloody stool x1  . Insomnia 2017  . Psoriasis of scalp     No past surgical history on file.  Social History   Socioeconomic History  . Marital status: Single    Spouse name: Not on file  . Number of children: Not on file  . Years of education: Not on file  . Highest education level: Not on file  Occupational History  . Not on file  Social Needs  . Financial resource strain: Not on file  . Food insecurity:    Worry: Not on file    Inability: Not on file  . Transportation needs:    Medical: Not on file     Non-medical: Not on file  Tobacco Use  . Smoking status: Never Smoker  . Smokeless tobacco: Never Used  Substance and Sexual Activity  . Alcohol use: Yes    Comment: 3x a week.   . Drug use: No  . Sexual activity: Yes    Birth control/protection: None  Lifestyle  . Physical activity:    Days per week: Not on file    Minutes per session: Not on file  . Stress: Not on file  Relationships  . Social connections:    Talks on phone: Not on file    Gets together: Not on file    Attends religious service: Not on file    Active member of club or organization: Not on file    Attends meetings of clubs or organizations: Not on file    Relationship status: Not on file  Other Topics Concern  . Not on file  Social History Narrative   Recently moved from [place]. Engaged to [employer]. Wedding Date 01/2016. 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 Wears his seatbelt. Wears a bike helmet. Exercises more than 3 times a week. Has a smoke alarm the home. Has firearms in the home.  FAMILY HISTORY:  We obtained a detailed, 4-generation family history.  Significant diagnoses are listed below: Family History  Problem Relation Age of Onset  . Alcohol abuse Mother        Lynch Syndrome- PMS2 mutation  . Lung cancer Father 64       nonsmoker  . Breast cancer Maternal Grandmother 33  . Endometrial cancer Maternal Great-grandmother 73  . Ovarian cancer Maternal Great-grandmother 26  . Colon cancer Neg Hx   . Prostate cancer Neg Hx   . Pancreatic cancer Neg Hx   . Gastric cancer Neg Hx     Paul [name] has no children.  He has a 66 year-old sister and a 57 year-old paternal half-brother.   Paul [name] father: died in his late 75's due to lung cancer dx at 93.  He was a non-smoker.  Paternal Aunts/Uncles: 3 paternal uncles with no hx fo cancer.  Paternal cousins: no hx of cancer.  Paternal grandfather: died of age-related disease.  Paternal grandmother:died of  emphysema, hx of smoking.   Paul [name] mother: 75- recently had genetic testing that was positive for Lynch Syndrome- PMS2- dup exon 63.  Maternal Aunts/Uncles: 1 maternal aunt has no hx of cancer.   Maternal cousins: 3 cousins, no hx of cancer.  Maternal grandfather: died of emphysema Maternal grandmother:deceased, hx of breast cancer dx at 35.  Her mother had uterine and ovarian cancer dx in her 68's.   Patient's ancestors through DNA testing are of N. European/Irish/Scottish descent.  There is no reported Ashkenazi Jewish ancestry. There is no known consanguinity.  CLINICAL CONDITION: Lynch Syndrome Lynch syndrome is characterized by an increased risk of colorectal cancer as well as cancers of the endometrium, ovary, prostate, stomach, small intestine, hepatobiliary tract, urinary tract, pancreas and brain (PMID: 8563149, [id number], [id number]). Lynch syndrome tumors typically demonstrate microsatellite instability (MSI) as well as loss of expression of the mismatch repair proteins on immunohistochemical (IHC) staining. This condition is caused by pathogenic variants in one of the mismatch repair genes-MLH1, MSH2, MSH6, PMS2-as well as deletions in the 3'end of the EPCAM gene.  RISKS specific to PMS2 mutations: We discussed that Lynch Syndrome caused by PMS2 mutations is typically more mild than Lynch syndrome caused by other mutations.   Colorectal: 12-20%  Endometrial: 0-15%  There may increased risks for the other Lynch syndrome associated cancers such as: Ovarian, Gastric, Pancreatic, Bladder/Urothelial, Prostate, small bowel, brain, and biliary tract.  However, the risks for these cancers in PMS2 mutation carriers specifically is not well established.   Please note: these estimates are from NCCN 3.2019.  These guidelines are continuously being updated and should be referenced directly when managing this patient's care in the future.   MANAGEMENT:Surveillance guidelines, as  published by the [employer]  (NCCN), suggest the following for individuals with Lynch syndrome (*NCCN. Genetic/Familial High-Risk Assessment: Colorectal. Version 3.2019):  Colon cancer:  - Colonoscopy every 1-2 years starting at 60-83 years of age, or 2-5 years prior to the earliest colon cancer if diagnosed before age 50.   Gynecological cancer:  Uterine: There is no clear evidence to support routine endometrial cancer screening, nor do the data support ovarian cancer screening; however annual office endometrial sampling is an option.  Education regarding prompt evaluation for dysfunctional uterine bleeding.  Prophylactic hysterectomy can be considered.               Ovarian: Insufficient evidence exists to make any specific recommendations for Prophylactic bilateral salpingo-oophorectomy (BSO) for  patients with mutations in MSH6 and PMS2.  This may be considered by women after childbearing is complete. Serum CA-125 for ovarian cancer and transvaginal ultrasound for ovarian and endometrial cancer may be considered at the clinician's discretion. These modalities have not been shown to be sufficiently sensitive or specific to support a positive recommendation.  Other extra-colonic cancers - While there is no clear evidence to support screening for gastric, duodenal, and small bowel cancer, select individuals, families, or those of Asian descent may consider esophagogastroduodenoscopy (EGD) with extended duodenoscopy every 3-5 years beginning at age 66-101 years of age. - Consider testing for and treating H. pylori. - Consider annual urinalysis beginning at 93-26 years of age to screen for urothelial cancer.  Particularly with Paul [name] brother being dx with urothelial cancer at 35, this is recommended.  However, limited evidence regarding efficacy of this screening.  - Consider annual  physical/neurologic examination starting at 44-28 years of age.        -Pancreatic cancer may be considered if a patient also has a family history of pancreatic cancer (on the side with the Lynch Syndrome).   Paul [name] does not report any family history of pancreatic cancer. Recommended screening includes annual endoscopic ultrasound and/or MRI of the pancreas starting at age 72 or 59 years younger than the earliest age of pancreatic cancer diagnosis in the family (PMID: 60737106).    Current data suggests that individuals with Lynch Syndrome mutations may be at an increased risk for prostate and breast cancer.  However at this time, insufficient evidence exists to recommend increased screening above the average population recommendations.   Please Note: Guidelines are continuously being updated and these should be referenced directly when managing a patient's care.     DISCUSSION:  We discussed that Lynch Syndrome is inherited autosomal dominantly- this means he and his maternal siblings each have a 50% risk to have inherited this familial PMS2 mutation.   If he carries this mutation and has children in the future, there would be a 50% risk to pass this mutation along to future children.  We also discussed autosomal recessive Constitutional Mismatch Repair Deficiency- he does not have this disease, but if he tests positive for the familial mutation would be a carrier.  We discussed the implications of this on family planning.    We also discussed the option to proceed with single site testing for the familial PMS2 mutation or panel testing.  Given his father's history of lung cancer at a young age and being a non-smoker, we felt it appropriate to consider testing of the EGFR gene as well.  He agreed, and elected to be tested for the Lynch Syndrome panel + EGFR with reflex to  Multi-cancer panel.   We discussed that if he is found to have a mutation in one of these genes, it may impact future  medical management recommendations such as increased cancer screenings and consideration of risk reducing surgeries.  A positive result could also have implications for the patient's family members.  A Negative result would mean he did not inherit the familial PMS2 mutation and would mean we did not find any other hereditary predisposition to cancer.  However, he understands genetic testing cannot rule any hereditary risk for cancer.  There could be mutations that are undetectable by current technology, or in genes not yet tested or identified to increase cancer risk.    We discussed the potential to find a Variant of Uncertain Significance or VUS.  These  are variants that have not yet been identified as pathogenic or benign, and it is unknown if this variant is associated with increased cancer risk or if this is a normal finding.  Most VUS's are reclassified to benign or likely benign.   It should not be used to make medical management decisions. With time, we suspect the lab will determine the significance of any VUS's identified if any.   Based on Paul [name] personal and family history of cancer, he meets medical criteria for genetic testing. Despite that he meets criteria, he may still have an out of pocket cost. The laboratory can provide an estimate of his OOP cost.  hospital was provided the contact information for the laboratory if hospital has further questions.  We did discuss that billing may be different when we test for multiple genes vs single site.   PLAN: After considering the risks, benefits, and limitations, Paul [name]  provided informed consent to pursue genetic testing and the blood sample was sent to [facility] for analysis of the Lynch syndrome panel + EGFR with plans to reflex to the multi-cancer panel. Results should be available within approximately 2-3 weeks' time, at which point they will be disclosed by telephone to Paul [name], as will any additional recommendations  warranted by these results. Paul [name] will receive a summary of his genetic counseling visit and a copy of his results once available. This information will also be available in Epic. We encouraged Paul [name] to remain in contact with cancer genetics annually so that we can continuously update the family history and inform him of any changes in cancer genetics and testing that may be of benefit for his family. Paul [name] questions were answered to his satisfaction today. Our contact information was provided should additional questions or concerns arise.  Based on Paul [name] family history, we recommended his maternal relatives also have genetic counseling and testing. Paul [name] will let us know if we can be of any assistance in coordinating genetic counseling and/or testing for this family member.   Lastly, we encouraged Paul [name] to remain in contact with cancer genetics annually so that we can continuously update the family history and inform him of any changes in cancer genetics and testing that may be of benefit for this family.   Mr.  [name] questions were answered to his satisfaction today. Our contact information was provided should additional questions or concerns arise. Thank you for the referral and allowing [place] to share in the care of your patient.   Tana Felts, MS, [facility] Certified Genetic Counselor [email or web address] phone: [phone]  The patient was seen for a total of 30 minutes in face-to-face genetic counseling. Dr.'s Karen Kitchens, and Lindi Adie were available for questions regarding this clinical encounter.

## 2018-09-26 DIAGNOSIS — M9902 Segmental and somatic dysfunction of thoracic region: Secondary | ICD-10-CM | POA: Diagnosis not present

## 2018-09-26 DIAGNOSIS — M9901 Segmental and somatic dysfunction of cervical region: Secondary | ICD-10-CM | POA: Diagnosis not present

## 2018-09-26 DIAGNOSIS — M9903 Segmental and somatic dysfunction of lumbar region: Secondary | ICD-10-CM | POA: Diagnosis not present

## 2018-09-26 DIAGNOSIS — M546 Pain in thoracic spine: Secondary | ICD-10-CM | POA: Diagnosis not present

## 2018-10-31 ENCOUNTER — Telehealth: Payer: Self-pay | Admitting: Genetics

## 2018-10-31 DIAGNOSIS — M9901 Segmental and somatic dysfunction of cervical region: Secondary | ICD-10-CM | POA: Diagnosis not present

## 2018-10-31 DIAGNOSIS — M9903 Segmental and somatic dysfunction of lumbar region: Secondary | ICD-10-CM | POA: Diagnosis not present

## 2018-10-31 DIAGNOSIS — M9902 Segmental and somatic dysfunction of thoracic region: Secondary | ICD-10-CM | POA: Diagnosis not present

## 2018-10-31 DIAGNOSIS — M546 Pain in thoracic spine: Secondary | ICD-10-CM | POA: Diagnosis not present

## 2018-10-31 NOTE — Telephone Encounter (Signed)
Returned patient's call regarding genetic testing results- I asked him to call me back and I can give him an update on his testing.

## 2018-11-04 NOTE — Progress Notes (Signed)
[name] [address], [place] [address] Phone: [phone] Subjective:   Paul [name], am serving as a scribe for Dr. Hulan Saas.  I   CC: Chest pain follow-up    Update 11/05/2018:  BUL:[id number]  Paul [name] is a 33 y.o. male coming in with complaint of chest pain. Patient states that his pain never did go away. Would like to discuss the uncomfortable feeling on the left side of the sternum. Pain is present in the morning and improves over the day as he pops that area. Patient feels when he is less active that his pain increases.  Patient is still more aware of it.  Not stopping him from any activities.  Has been working out on a more regular basis doing more help upper body and thinks that is contributing to some of the discomfort and pain.   Brings up a right ankle injury, did twist and playing basketball, mild swelling, not severe pain noted.  Patient though would like to be active but is concerned that continues to hurt self.  Past Medical History:  Diagnosis Date  . Anxiety   . Arthritis   . Back pain    03/17/12 xray: minimal compression deformity L1. 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  . Chickenpox    childhood   . Family history of breast cancer   . Family history of lung cancer   . Family history of Lynch syndrome   . Heart murmur    present since childhood, normal echo 2002  . IBS (irritable bowel syndrome) 2017   bloody stool x1  . Insomnia 2017  . Psoriasis of scalp    [name] past surgical history on file. Social History   Socioeconomic History  . Marital status: Married    Spouse name: Not on file  . Number of children: Not on file  . Years of education: Not on file  . Highest education level: Not on file  Occupational History  . Not on file  Social Needs  . Financial resource strain: Not on file  . Food insecurity:    Worry: Not on file    Inability: Not on file  .  Transportation needs:    Medical: Not on file    Non-medical: Not on file  Tobacco Use  . Smoking status: Never Smoker  . Smokeless tobacco: Never Used  Substance and Sexual Activity  . Alcohol use: Yes    Comment: 3x a week.   . Drug use: [name]  . Sexual activity: Yes    Birth control/protection: None  Lifestyle  . Physical activity:    Days per week: Not on file    Minutes per session: Not on file  . Stress: Not on file  Relationships  . Social connections:    Talks on phone: Not on file    Gets together: Not on file    Attends religious service: Not on file    Active member of club or organization: Not on file    Attends meetings of clubs or organizations: Not on file    Relationship status: Not on file  Other Topics Concern  . Not on file  Social History Narrative   Recently moved from [place]. Engaged to [employer]. Wedding Date 01/2016. 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 [name] children. 1 dog. Wears his seatbelt. Wears a bike helmet. Exercises more than 3 times a week. Has a smoke alarm the home. Has  firearms in the home.   [name] Known Allergies Family History  Problem Relation Age of Onset  . Alcohol abuse Mother        Lynch Syndrome- PMS2 mutation  . Lung cancer Father 34       nonsmoker  . Breast cancer Maternal Grandmother 10  . Endometrial cancer Maternal Great-grandmother 52  . Ovarian cancer Maternal Great-grandmother 34  . Colon cancer Neg Hx   . Prostate cancer Neg Hx   . Pancreatic cancer Neg Hx   . Gastric cancer Neg Hx      Current Outpatient Medications (Cardiovascular):  .  nitroGLYCERIN (NITRODUR - DOSED IN MG/24 HR) 0.2 mg/hr patch, 1/4 patch daily     Current Outpatient Medications (Other):  Marland Kitchen  Vitamin D, Ergocalciferol, (DRISDOL) 1.25 MG (50000 UT) CAPS capsule, Take 1 capsule (50,000 Units total) by mouth every 7 (seven) days.    Past medical history, social, surgical and family history all reviewed in electronic medical record.  [name]  pertanent information unless stated regarding to the chief complaint.   Review of Systems:  [name] headache, visual changes, nausea, vomiting, diarrhea, constipation, dizziness, abdominal pain, skin rash, fevers, chills, night sweats, weight loss, swollen lymph nodes, body aches, joint swelling, muscle aches, chest pain, shortness of breath, mood changes.   Objective  Blood pressure 124/88, height 6' 4" (1.93 m), weight 206 lb (93.4 kg).   General: [name] apparent distress alert and oriented x3 mood and affect normal, dressed appropriately.  HEENT: Pupils equal, extraocular movements intact  Respiratory: Patient's speak in full sentences and does not appear short of breath  Cardiovascular: [name] lower extremity edema, non tender, [name] erythema  Skin: Warm dry intact with [name] signs of infection or rash on extremities or on axial skeleton.  Abdomen: Soft nontender  Neuro: Cranial nerves II through XII are intact, neurovascularly intact in all extremities with 2+ DTRs and 2+ pulses.  Lymph: [name] lymphadenopathy of posterior or anterior cervical chain or axillae bilaterally.  Gait normal with good balance and coordination.  MSK:  Non tender with full range of motion and good stability and symmetric strength and tone of shoulders, elbows, wrist, hip, knee and ankles bilaterally.  Chest exam shows the patient may have some mild hypertrophy of the fifth rib at the sternal border.  Mildly tender in that area.  Mild pain in the intercostal between the fourth and fifth rib.  [name] true defect noted.  Some mild pain with resisted adduction of the arm.  Right ankle exam shows some mild laxity of the ATFL but good endpoint noted.  Negative anterior drawer.  Full range of motion otherwise.  Limited musculoskeletal ultrasound was performed and interpreted by [name]  [name] true cortical defect noted of any of the bones of the ribs on the left side or of the sternum.  There is some mild abnormal vascularity near the bone  itself could be more of a stress reaction or a cartilage injury.  [name] masses appreciated.  [id number]; 15 additional minutes spent for Therapeutic exercises as stated in above notes.  This included exercises focusing on stretching, strengthening, with significant focus on eccentric aspects.   Long term goals include an improvement in range of motion, strength, endurance as well as avoiding reinjury. Patient's frequency would include in 1-2 times a day, 3-5 times a week for a duration of 6-12 weeks.  Ankle strengthening that included:  Basic range of motion exercises to allow proper full motion at ankle Stretching of  the lower leg and hamstrings  Theraband exercises for the lower leg - inversion, eversion, dorsiflexion and plantarflexion each to be completed with a theraband Balance exercises to increase proprioception Weight bearing exercises to increase strength and balance Proper technique shown and discussed handout in great detail with ATC.  All questions were discussed and answered.      Impression and Recommendations:     This case required medical decision making of moderate complexity. The above documentation has been reviewed and is accurate and complete [name], DO       Note: This dictation was prepared with Dragon dictation along with smaller phrase technology. Any transcriptional errors that result from this process are unintentional.

## 2018-11-05 ENCOUNTER — Ambulatory Visit: Payer: BLUE CROSS/BLUE SHIELD | Admitting: Family Medicine

## 2018-11-05 ENCOUNTER — Ambulatory Visit: Payer: Self-pay

## 2018-11-05 ENCOUNTER — Encounter: Payer: Self-pay | Admitting: Family Medicine

## 2018-11-05 ENCOUNTER — Telehealth: Payer: Self-pay | Admitting: Genetics

## 2018-11-05 VITALS — BP 124/88 | Ht 76.0 in | Wt 206.0 lb

## 2018-11-05 DIAGNOSIS — R0781 Pleurodynia: Secondary | ICD-10-CM

## 2018-11-05 DIAGNOSIS — G8929 Other chronic pain: Secondary | ICD-10-CM | POA: Diagnosis not present

## 2018-11-05 DIAGNOSIS — M25571 Pain in right ankle and joints of right foot: Secondary | ICD-10-CM | POA: Diagnosis not present

## 2018-11-05 DIAGNOSIS — M940 Chondrocostal junction syndrome [Tietze]: Secondary | ICD-10-CM

## 2018-11-05 MED ORDER — VITAMIN D (ERGOCALCIFEROL) 1.25 MG (50000 UNIT) PO CAPS: 50000.0000 [IU] | ORAL_CAPSULE | ORAL | 0 refills | Status: DC

## 2018-11-05 MED ORDER — NITROGLYCERIN 0.2 MG/HR TD PT24: MEDICATED_PATCH | TRANSDERMAL | 1 refills | Status: DC

## 2018-11-05 NOTE — Assessment & Plan Note (Signed)
No significant instability.  Home exercises given, discussed bracing with patient doing certain activities such as basketball.  Follow-up again with me in 4 to 6 weeks

## 2018-11-05 NOTE — Assessment & Plan Note (Signed)
Patient does not have signs of the true costochondritis.  I will concern for more of a possible cartilage injury that does not seem to have completely resolved at this time.  Discussed with patient about home exercise, icing regimen, we discussed vitamin D supplementation as well as nitroglycerin due to the vascularity in the area.  Patient warned of potential side effects for all these things.  Discussed with patient about avoiding certain activities.  Patient will follow-up with me again 4 weeks.  We did discuss potential laboratory work-up which patient declined.

## 2018-11-05 NOTE — Telephone Encounter (Signed)
Discussed genetic test results with patient.   I have heard from the lab [facility]'s lab director and a genetic counselor there regarding his testing.   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  They do not feel that additional analysis is going to provide any clarification.   I also discussed conversation with [facility], the lab his mother's testing was done at, regarding the classification.  They indicated they felt confident in their classification based on polymorphism data they have.   I discussed with Paul [name] what this means clinically. As it currently stands, he has a 'uncertain' result , his mother's duplication has been detected, but his genetic test result is not indicating a diagnosis of Lynch syndrome.    Our team is still suspicious this may indeed be a PMS2 mutation and is suspicious for Lynch syndrome given the other lab's classification- although we acknowledge there is some ambiguity given his current result.  We discussed if he would like to be managed as if he has Lynch syndrome (as we are suspicious of), he would need a documented diagnosis/positive result for Lynch Syndrome.  The way to obtain this would be to send genetic testing again, but to the lab, [facility], where we know they are calling this variant positive.  We discussed that his insurance is unlikely to cover genetic testing twice.  The lab [facility] offers single site analysis (for the family variant) for $300 self pay price.   After our discussion, [name] decided he would like to have a documented diagnosis of Lynch Syndrome and therefore would like to send testing to the lab [facility].(single site).   We scheduled his lab draw for 11/13/2018 at 7:30AM.  His blood will be sent to [facility] for single site analysis of the  familial variant.   He will reach out to [place] if he has further questions.  I will also email him my colleagues phone number so he has another contact should he not be able to reach me.

## 2018-11-05 NOTE — Patient Instructions (Addendum)
Good to see you  Ice 20 minutes 2 times daily. Usually after activity and before bed. Nitroglycerin Protocol   Apply 1/4 nitroglycerin patch to affected area daily.  Change position of patch within the affected area every 24 hours.  You may experience a headache during the first 1-2 weeks of using the patch, these should subside.  If you experience headaches after beginning nitroglycerin patch treatment, you may take your preferred over the counter pain reliever.  Another side effect of the nitroglycerin patch is skin irritation or rash related to patch adhesive.  Please notify our office if you develop more severe headaches or rash, and stop the patch.  Tendon healing with nitroglycerin patch may require 12 to 24 weeks depending on the extent of injury.  Men should not use if taking Viagra, Cialis, or Levitra.   Do not use if you have migraines or rosacea. Once weekly vitamin D for 8 weeks to help with muscle strength and endurance New exercises for the ankle 3 times a week  See me again in 5-6 weeks and lets see how you are doing.  If not better we will need labs

## 2018-11-13 ENCOUNTER — Inpatient Hospital Stay: Payer: BLUE CROSS/BLUE SHIELD | Attending: Genetic Counselor

## 2018-11-13 ENCOUNTER — Other Ambulatory Visit: Payer: BLUE CROSS/BLUE SHIELD

## 2018-11-25 ENCOUNTER — Other Ambulatory Visit: Payer: Self-pay | Admitting: Family Medicine

## 2018-11-27 ENCOUNTER — Telehealth: Payer: Self-pay | Admitting: Licensed Clinical Social Worker

## 2018-12-02 ENCOUNTER — Encounter: Payer: Self-pay | Admitting: Licensed Clinical Social Worker

## 2018-12-02 ENCOUNTER — Ambulatory Visit: Payer: Self-pay | Admitting: Licensed Clinical Social Worker

## 2018-12-02 DIAGNOSIS — Z1509 Genetic susceptibility to other malignant neoplasm: Secondary | ICD-10-CM

## 2018-12-02 DIAGNOSIS — Z1379 Encounter for other screening for genetic and chromosomal anomalies: Secondary | ICD-10-CM

## 2018-12-02 NOTE — Progress Notes (Signed)
HPI:  [name] was previously seen in the [facility] due to a family history of Lynch syndrome ( PMS2 pathogenic variant) and concerns regarding a hereditary predisposition to cancer. Please refer to our prior cancer genetics clinic note for more information regarding our discussion, assessment and recommendations, at the time. [name] recent genetic test results were disclosed to him, as were recommendations warranted by these results. These results and recommendations are discussed in more detail below.  CANCER HISTORY:   No history exists.    FAMILY HISTORY:  We obtained a detailed, 4-generation family history.  Significant diagnoses are listed below: Family History  Problem Relation Age of Onset  . Alcohol abuse Mother        Lynch Syndrome- PMS2 mutation  . Lung cancer Father 32       nonsmoker  . Breast cancer Maternal Grandmother 58  . Endometrial cancer Maternal Great-grandmother 73  . Ovarian cancer Maternal Great-grandmother 78  . Colon cancer Neg Hx   . Prostate cancer Neg Hx   . Pancreatic cancer Neg Hx   . Gastric cancer Neg Hx     [name] has no children.  He has a 34 year-old sister and a 48 year-old paternal half-brother.   [name] father: died in his late 45's due to lung cancer dx at 41.  He was a non-smoker.  Paternal Aunts/Uncles: 3 paternal uncles with no hx fo cancer.  Paternal cousins: no hx of cancer.  Paternal grandfather: died of age-related disease.  Paternal grandmother:died of emphysema, hx of smoking.   [name] mother: 2- recently had genetic testing that was positive for Lynch Syndrome- PMS2- dup exon 27.  Maternal Aunts/Uncles: 1 maternal aunt has no hx of cancer.   Maternal cousins: 3 cousins, no hx of cancer.  Maternal grandfather: died of emphysema Maternal grandmother:deceased, hx of breast cancer dx at 55.  Her mother had uterine and ovarian cancer dx in her 29's.   Patient's ancestors through  DNA testing are of N. European/Irish/[place] descent.  There is no reported Ashkenazi Jewish ancestry. There is no known consanguinity.  GENETIC TEST RESULTS: Genetic testing reported out on 10/29/2018  through the [facility]- cancer panel found a variant in either the PMS2 or PMS2CL gene.  The Multi-Cancer Panel offered by [facility] includes sequencing and/or deletion duplication testing of the following 90 genes: AIP, ALK, APC, ATM, AXIN2, BAP1, BARD1, BLM, BMPR1A, BRCA1, BRCA2, BRIP1, BUB1B, CASR, CDC73, CDH1, CDK4, CDKN1B, CDKN1C, CDKN2A (p14ARF), CDKN2A (p16INK4a), CEBPA, CHEK2, CTNNA1, DICER1, DIS3L2, EGFR, ENG, EPCAM*, FH, FLCN, GALNT12, GATA2, GPC3, GREM1*, HOXB13, HRAS, KIT, MAX, MEN1, MET, MITF*, MLH1, MLH3, MSH2, MSH3, MSH6, MUTYH, NBN, NF1, NF2, NTHL1, PALB2, PDGFRA, PHOX2B*, PMS2, POLD1, POLE, POT1, PRKAR1A, PTCH1, PTEN, RAD50, RAD51C, RAD51D, RB1, RECQL4, RET, RNF43, RPS20, RUNX1, SDHA*, SDHAF2, SDHB, SDHC, SDHD, SMAD4, SMARCA4, SMARCB1, SMARCE1, STK11, SUFU, TERC, TERT, TMEM127, TP53, TSC1, TSC2, VHL, WRN*, WT1  The lab was unable to determine which gene this variant was in. Therefore, testing was ordered through [facility]. This lab found the mutation in his mother. 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  These test reports have been scanned into EPIC and are located under the Molecular Pathology section of the Results Review tab.  A portion of these result reports are included below for reference.       CLINICAL CONDITION: Lynch Syndrome Lynch syndrome is characterized  by an increased risk of colorectal cancer as well as cancers of the endometrium, ovary, prostate, stomach, small intestine, hepatobiliary tract, urinary tract, pancreas and brain (PMID: 5329924, [id number], [id number]). Lynch syndrome tumors typically demonstrate microsatellite instability (MSI) as well as loss of expression of the  mismatch repair proteins on immunohistochemical (IHC) staining. This condition is caused by pathogenic variants in one of the mismatch repair genes-MLH1, MSH2, MSH6, PMS2-as well as deletions in the 3'end of the EPCAM gene.  RISKS specific to PMS2 mutations: We discussed that Lynch Syndrome caused by PMS2 mutations is typically more mild than Lynch syndrome caused by other mutations.   Colorectal: 12-20%  Endometrial: 0-15%  There may increased risks for the other Lynch syndrome associated cancers such as: Ovarian, Gastric, Pancreatic, Bladder/Urothelial, Prostate, small bowel, brain, and biliary tract.  However, the risks for these cancers in PMS2 mutation carriers specifically is not well established.   Please note: these estimates are from NCCN 3.2019. These guidelines are continuously being updated and should be referenced directly when managing this patient's care in the future.   MANAGEMENT:Surveillance guidelines, as published by the [employer]  (NCCN), suggest the following for individuals with Lynch syndrome (*NCCN. Genetic/Familial High-Risk Assessment: Colorectal. QJJHERD4.0814):  Colon cancer:  - Colonoscopy every 1-2 years starting at 68-69 years of age, or 2-5 years prior to the earliest colon cancer if diagnosed before age 8.   Gynecological cancer:  Uterine: There is no clear evidence to support routine endometrial cancer screening, nor do the data support ovarian cancer screening; however annual office endometrial sampling is an option. Education regarding prompt evaluation for dysfunctional uterine bleeding. Prophylactic hysterectomy can be considered.  Ovarian: Insufficient evidence exists to make any specific recommendations for Prophylactic bilateral salpingo-oophorectomy (BSO) for patients with mutations in MSH6 and PMS2.  This may be considered by women after childbearing is complete. Serum  CA-125 for ovarian cancer and transvaginal ultrasound for ovarian and endometrial cancer may be considered at the clinician's discretion. These modalities have not been shown to be sufficiently sensitive or specific to support a positive recommendation.  Other extra-colonic cancers - While there is no clear evidence to support screening for gastric, duodenal, and small bowel cancer, select individuals, families, or those of Asian descent may consider esophagogastroduodenoscopy (EGD) with extended duodenoscopy every 3-5 years beginning at age 62-50 years of age. - Consider testing for and treating H. pylori. - Consider annual urinalysisbeginning at 69-5 years of age to screen for urothelial cancer. Particularly with [name]  brother being dx with urothelial cancer at 49, this is recommended. However, limited evidence regarding efficacy of this screening.  - Consider annual physical/neurologic examination starting at 32-89 years of age. -Pancreatic cancermay be considered if a patient also has a family history of pancreatic cancer (on the side with the Lynch Syndrome). Paul [name] does not report any family history of pancreatic cancer. Recommended screening includes annual endoscopic ultrasound and/or MRI of the pancreas starting at age 77 or 43 years younger than the earliest age of pancreatic cancer diagnosis in the family (PMID: [id number]).   Current data suggests that individuals with Lynch Syndrome mutations may be at an increased risk for prostate and breast cancer. However at this time, insufficient evidence exists to recommend increased screening above the average population recommendations.   Please Note: Guidelines are continuously being updated and these should be referenced directly when managing a patient's care.  FAMILY MEMBERS:  Lynch Syndrome has Autosomal Dominant Inheritance.   It is important that  all of Mr.  Paul [name] relatives (both men and women) know of the presence of this gene mutation. Site-specific genetic testing can sort out who in the family is at risk and who is not. We will be happy to meet with any of the family members or refer them to a genetic counselor in their local area. To locate genetic counselors in other cities, individuals can visit the website of the [employer] (ArtistMovie.se) and Secretary/administrator for a Social worker by zip code.   Paul [name] siblings have a 50% chance to have inherited this mutation. We recommend they have genetic testing for this same mutation, as identifying the presence of this mutation would allow them to also take advantage of risk-reducing measures.   Children who inherit two mutations in the same Lynch gene, one mutation from each parent, are at-risk of a rare recessive condition called constitutional mismatch repair deficiency (CMMR-D) syndrome. If family members have this mutation, they may wish to have their partner tested if they are planning on having children.  PLAN:   1. The patient does not currently have a GI physician. He would like these results to be shared with his primary care provider, Dr. Howard [name], and for this provider to help coordinate his care. He plans to establish care with a GI physician.  2. Paul [name] plans to let family members know of the presence of this mutation. He knows that we are happy to see any of his relatives.   RESOURCES: Paul [name] was given patient information about the support group, "[employer]iveAndKickn," a resource for patients with Lynch syndrome. He also provided information about the cancer registry program, Vivia Ewing, out of [employer].  The cancer registry provides a way for researchers and patients to link together.  [facility] also provides a biannual newsletter that provides updated information about hereditary cancer genes and syndromes.  We strongly encouraged [name] to remain in contact  with [place] in cancer genetics on an annual basis so we can update his personal and family histories, and inform him of advances in cancer genetics that may be of benefit for the entire family. [name] knows he is also welcome to call with any questions or concerns at any time.   Faith Rogue, MS Genetic Counselor [email or web address] Phone: [phone]

## 2018-12-02 NOTE — Telephone Encounter (Signed)
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 We discussed this in result in detail. Patient would like this information shared with his PCP.

## 2018-12-12 ENCOUNTER — Encounter: Payer: Self-pay | Admitting: Family Medicine

## 2018-12-12 ENCOUNTER — Ambulatory Visit (INDEPENDENT_AMBULATORY_CARE_PROVIDER_SITE_OTHER): Payer: BLUE CROSS/BLUE SHIELD | Admitting: Family Medicine

## 2018-12-12 ENCOUNTER — Other Ambulatory Visit: Payer: Self-pay

## 2018-12-12 DIAGNOSIS — R1013 Epigastric pain: Secondary | ICD-10-CM | POA: Diagnosis not present

## 2018-12-12 DIAGNOSIS — R079 Chest pain, unspecified: Secondary | ICD-10-CM

## 2018-12-12 DIAGNOSIS — Z125 Encounter for screening for malignant neoplasm of prostate: Secondary | ICD-10-CM

## 2018-12-12 DIAGNOSIS — M25579 Pain in unspecified ankle and joints of unspecified foot: Secondary | ICD-10-CM

## 2018-12-12 DIAGNOSIS — Z1509 Genetic susceptibility to other malignant neoplasm: Secondary | ICD-10-CM | POA: Diagnosis not present

## 2018-12-12 DIAGNOSIS — Z9189 Other specified personal risk factors, not elsewhere classified: Secondary | ICD-10-CM | POA: Diagnosis not present

## 2018-12-12 DIAGNOSIS — R5383 Other fatigue: Secondary | ICD-10-CM | POA: Insufficient documentation

## 2018-12-12 DIAGNOSIS — M2550 Pain in unspecified joint: Secondary | ICD-10-CM

## 2018-12-12 NOTE — Patient Instructions (Signed)
Virtual visit 

## 2018-12-12 NOTE — Progress Notes (Signed)
Virtual Visit via Video   I connected with Paul [name] on 12/12/18 at  9:30 AM EDT by a video enabled telemedicine application and verified that I am speaking with the correct person using two identifiers. Location patient: Home Location provider: [facility], Office Persons participating in the virtual visit: Patient, Dr. Raoul [name] and R.Baker, LPN  I discussed the limitations of evaluation and management by telemedicine and the availability of in person appointments. The patient expressed understanding and agreed to proceed.  Subjective:   Chief Complaint  Patient presents with  . Discuss test results    Pt had genetic testing done and would like to go over results     HPI: Paul [name] is a 33 y.o. male present to discuss his genetic test findings.  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  The family history of Lynch syndrome in his mother, breast cancer in his maternal grandmother, endometrial/ovarian cancer in his maternal great-grandmother and a male relative on his mother side with urothelial cancer. Patient is also been seeing sports med for his atypical chest [name]/gastric discomfort which was felt to be muscle skeletal in nature and possible costochondritis and somatic dysfunction of his ribs.  He has also had repetitive ankle discomfort and arthralgias, along with fatigue.  ROS: See pertinent positives and negatives per HPI.  Patient Active Problem List   Diagnosis Date Noted  . Genetic testing 12/02/2018  . PMS2-related Lynch syndrome (HNPCC4) 12/02/2018  . Right ankle [name] 11/05/2018  . Family history of Lynch syndrome   . Family history of breast cancer   . Family history of lung cancer   . Somatic dysfunction of rib 06/19/2018  . Costochondritis 05/21/2018  . Psoriasis of scalp 05/04/2015    Social History   Tobacco Use  . Smoking status: Never Smoker  . Smokeless tobacco: Never Used  Substance Use Topics  .  Alcohol use: Yes    Comment: 3x a week.     Current Outpatient Medications:  .  nitroGLYCERIN (NITRODUR - DOSED IN MG/24 HR) 0.2 mg/hr patch, 1/4 patch daily, Disp: 30 patch, Rfl: 1 .  Vitamin D, Ergocalciferol, (DRISDOL) 1.25 MG (50000 UT) CAPS capsule, TAKE 1 CAPSULE (50,000 UNITS TOTAL) BY MOUTH EVERY 7 (SEVEN) DAYS., Disp: 4 capsule, Rfl: 1  No Known Allergies  Objective:  There were no vitals taken for this visit. Gen: No acute distress. Nontoxic in appearance.  HENT: AT. [place].  MMM.  Eyes:  Conjunctiva without redness, discharge or icterus. Chest: Cough and shortness of breath not present Neuro:  Alert. Oriented x3  Psych: Normal affect, dress and demeanor. Normal speech. Normal thought content and judgment.   Assessment and Plan:  Paul [name] is a 33 y.o. male PMS2-related Lynch syndrome (HNPCC4)/High risk for colon cancer/ Prostate cancer screening encounter, options and risks discussed Discussed results with patient today.  He was given the opportunity to ask questions.  Labs ordered.  Patient was encouraged that he should have a yearly physical with neuro exam and labs.  We will also consider urology referral if he desires or if his urinalysis or PSA is abnormal. - Ambulatory referral to Gastroenterology - Urinalysis, Routine w reflex microscopic; Future--> recommended yearly with Lynch - CBC w/Diff; Future - Comp Met (CMET); Future - PSA; Future--> recommended yearly with Lynch - H. pylori antibody, IgG; Future--> egd and H.Pylori recommended with Lynch - Ambulatory referral to Gastroenterology--> colonoscopy q 1-2 years recommended  with Paul [name] -  CBC w/Diff; Future - Comp Met (CMET); Future  Epigastric discomfort/Chest [name], unspecified type - continue w/ SM.  - H. pylori antibody, IgG; Future - Lipase; Future - CMP - GI referral placed.    Acute ankle [name], unspecified laterality/Arthralgia, unspecified joint/fatigue -Will follow with sports medicine. - Uric  acid; Future - Vitamin D (25 hydroxy); Future - PTH, Intact and Calcium; Future  F/u yearly for CPE, Sooner if needed   > 25 minutes spent with patient, >50% of time spent face to face counseling    Paul Pouch, DO 12/12/2018

## 2018-12-13 ENCOUNTER — Other Ambulatory Visit: Payer: BLUE CROSS/BLUE SHIELD

## 2018-12-15 NOTE — Progress Notes (Signed)
[name] [place] [place], [place] [address] Phone: [phone] Subjective:   Virtual Visit via Video Note  I connected with Paul [name] on 12/16/18 at  9:30 AM EDT by a video enabled telemedicine application and verified that I am speaking with the correct person using two identifiers.   I discussed the limitations of evaluation and management by telemedicine and the availability of in person appointments. The patient expressed understanding and agreed to proceed.    I discussed the assessment and treatment plan with the patient. The patient was provided an opportunity to ask questions and all were answered. The patient agreed with the plan and demonstrated an understanding of the instructions.   The patient was advised to call back or seek an in-person evaluation if the symptoms worsen or if the condition fails to improve as anticipated.     [name], DO  More detailed note below  CC: Rib [name] and ankle [name] follow-up  JSR:[id number]  Paul [name] is a 33 y.o. male coming in with complaint of chest discomfort and [name].  Patient was having more of a costochondritis and appeared to have a potential injury.  Did not notice any improvement with the nitroglycerin patches.  Patient states that this does feel a little bit better than other days not.  Patient did take the 4 weeks of vitamin D but did not get a refill.  Did not notice any differences with that either.  Has not noticed association with eating at the moment.  Still though states that this comes and goes and has not found any recently no antacids.   Since we have seen patient patient was diagnosed with Lynch syndrome.  Past Medical History:  Diagnosis Date  . Anxiety   . Arthritis   . Back [name]    03/17/12 xray: minimal compression deformity L1. 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  . Chickenpox    childhood   . Family history of breast cancer   .  Family history of lung cancer   . Family history of Lynch syndrome   . Heart murmur    present since childhood, normal echo 2002  . IBS (irritable bowel syndrome) 2017   bloody stool x1  . Insomnia 2017  . Psoriasis of scalp    No past surgical history on file. Social History   Socioeconomic History  . Marital status: Married    Spouse name: Not on file  . Number of children: Not on file  . Years of education: Not on file  . Highest education level: Not on file  Occupational History  . Not on file  Social Needs  . Financial resource strain: Not on file  . Food insecurity:    Worry: Not on file    Inability: Not on file  . Transportation needs:    Medical: Not on file    Non-medical: Not on file  Tobacco Use  . Smoking status: Never Smoker  . Smokeless tobacco: Never Used  Substance and Sexual Activity  . Alcohol use: Yes    Comment: 3x a week.   . Drug use: No  . Sexual activity: Yes    Birth control/protection: None  Lifestyle  . Physical activity:    Days per week: Not on file    Minutes per session: Not on file  . Stress: Not on file  Relationships  . Social connections:    Talks on phone: Not on file  Gets together: Not on file    Attends religious service: Not on file    Active member of club or organization: Not on file    Attends meetings of clubs or organizations: Not on file    Relationship status: Not on file  Other Topics Concern  . Not on file  Social History Narrative   Originally from  [place]. Married to [name].    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    Wears his seatbelt. Wears a bike helmet.    Exercises more than 3 times a week.    Has a smoke alarm the home.    Has firearms in the home.   No Known Allergies Family History  Problem Relation Age of Onset  . Alcohol abuse Mother        Lynch Syndrome- PMS2 mutation  . Lung cancer Father 30       nonsmoker  . Breast cancer Maternal Grandmother 19  .  Endometrial cancer Maternal Great-grandmother 67  . Ovarian cancer Maternal Great-grandmother 51  . Cancer Other 54       urothelial cancer  . Colon cancer Neg Hx   . Prostate cancer Neg Hx   . Pancreatic cancer Neg Hx   . Gastric cancer Neg Hx      Current Outpatient Medications (Cardiovascular):  .  nitroGLYCERIN (NITRODUR - DOSED IN MG/24 HR) 0.2 mg/hr patch, 1/4 patch daily (Patient not taking: Reported on 12/12/2018)     Current Outpatient Medications (Other):  Marland Kitchen  Vitamin D, Ergocalciferol, (DRISDOL) 1.25 MG (50000 UT) CAPS capsule, Take 1 capsule (50,000 Units total) by mouth every 7 (seven) days.    Past medical history, social, surgical and family history all reviewed in electronic medical record.  No pertanent information unless stated regarding to the chief complaint.   Review of Systems:  No headache, visual changes, nausea, vomiting, diarrhea, constipation, dizziness, abdominal [name], skin rash, fevers, chills, night sweats, weight loss, swollen lymph nodes,, chest [name], shortness of breath, mood changes.  Positive for intermittent muscle aches and body aches  Objective     General: No apparent distress alert and oriented x3 mood and affect normal, dressed appropriately.  HEENT: Pupils equal, extraocular movements intact      Impression and Recommendations:    . The above documentation has been reviewed and is accurate and complete [name], DO       Note: This dictation was prepared with Dragon dictation along with smaller phrase technology. Any transcriptional errors that result from this process are unintentional.

## 2018-12-16 ENCOUNTER — Other Ambulatory Visit (INDEPENDENT_AMBULATORY_CARE_PROVIDER_SITE_OTHER): Payer: BLUE CROSS/BLUE SHIELD

## 2018-12-16 ENCOUNTER — Ambulatory Visit (INDEPENDENT_AMBULATORY_CARE_PROVIDER_SITE_OTHER): Payer: BLUE CROSS/BLUE SHIELD | Admitting: Family Medicine

## 2018-12-16 ENCOUNTER — Other Ambulatory Visit: Payer: Self-pay

## 2018-12-16 ENCOUNTER — Encounter: Payer: Self-pay | Admitting: Family Medicine

## 2018-12-16 DIAGNOSIS — M25579 Pain in unspecified ankle and joints of unspecified foot: Secondary | ICD-10-CM | POA: Diagnosis not present

## 2018-12-16 DIAGNOSIS — Z9189 Other specified personal risk factors, not elsewhere classified: Secondary | ICD-10-CM

## 2018-12-16 DIAGNOSIS — R5383 Other fatigue: Secondary | ICD-10-CM | POA: Diagnosis not present

## 2018-12-16 DIAGNOSIS — Z1509 Genetic susceptibility to other malignant neoplasm: Secondary | ICD-10-CM | POA: Diagnosis not present

## 2018-12-16 DIAGNOSIS — Z125 Encounter for screening for malignant neoplasm of prostate: Secondary | ICD-10-CM

## 2018-12-16 DIAGNOSIS — R079 Chest pain, unspecified: Secondary | ICD-10-CM | POA: Diagnosis not present

## 2018-12-16 DIAGNOSIS — R1013 Epigastric pain: Secondary | ICD-10-CM | POA: Diagnosis not present

## 2018-12-16 DIAGNOSIS — M940 Chondrocostal junction syndrome [Tietze]: Secondary | ICD-10-CM | POA: Diagnosis not present

## 2018-12-16 DIAGNOSIS — M2550 Pain in unspecified joint: Secondary | ICD-10-CM | POA: Diagnosis not present

## 2018-12-16 LAB — CBC WITH DIFFERENTIAL/PLATELET
Basophils Absolute: 0 10*3/uL (ref 0.0–0.1)
Basophils Relative: 0.4 % (ref 0.0–3.0)
Eosinophils Absolute: 0 10*3/uL (ref 0.0–0.7)
Eosinophils Relative: 0.2 % (ref 0.0–5.0)
HCT: 42.3 % (ref 39.0–52.0)
Hemoglobin: 14.3 g/dL (ref 13.0–17.0)
Lymphocytes Relative: 34.9 % (ref 12.0–46.0)
Lymphs Abs: 1.7 10*3/uL (ref 0.7–4.0)
MCHC: 33.8 g/dL (ref 30.0–36.0)
MCV: 87.5 fl (ref 78.0–100.0)
Monocytes Absolute: 0.3 10*3/uL (ref 0.1–1.0)
Monocytes Relative: 6.8 % (ref 3.0–12.0)
Neutro Abs: 2.8 10*3/uL (ref 1.4–7.7)
Neutrophils Relative %: 57.7 % (ref 43.0–77.0)
Platelets: 292 10*3/uL (ref 150.0–400.0)
RBC: 4.84 Mil/uL (ref 4.22–5.81)
RDW: 13.1 % (ref 11.5–15.5)
WBC: 4.9 10*3/uL (ref 4.0–10.5)

## 2018-12-16 LAB — URINALYSIS, ROUTINE W REFLEX MICROSCOPIC
Bilirubin Urine: NEGATIVE
Hgb urine dipstick: NEGATIVE
Ketones, ur: NEGATIVE
Leukocytes,Ua: NEGATIVE
Nitrite: NEGATIVE
RBC / HPF: NONE SEEN (ref 0–?)
Specific Gravity, Urine: 1.025 (ref 1.000–1.030)
Total Protein, Urine: NEGATIVE
Urine Glucose: NEGATIVE
Urobilinogen, UA: 0.2 (ref 0.0–1.0)
pH: 5 (ref 5.0–8.0)

## 2018-12-16 LAB — COMPREHENSIVE METABOLIC PANEL
ALT: 14 U/L (ref 0–53)
AST: 13 U/L (ref 0–37)
Albumin: 4.8 g/dL (ref 3.5–5.2)
Alkaline Phosphatase: 41 U/L (ref 39–117)
BUN: 15 mg/dL (ref 6–23)
CO2: 25 mEq/L (ref 19–32)
Calcium: 9.5 mg/dL (ref 8.4–10.5)
Chloride: 101 mEq/L (ref 96–112)
Creatinine, Ser: 0.97 mg/dL (ref 0.40–1.50)
GFR: 89.64 mL/min (ref >60.00)
Glucose, Bld: 103 mg/dL — ABNORMAL HIGH (ref 70–99)
Potassium: 4.4 mEq/L (ref 3.5–5.1)
Sodium: 136 mEq/L (ref 135–145)
Total Bilirubin: 0.8 mg/dL (ref 0.2–1.2)
Total Protein: 6.9 g/dL (ref 6.0–8.3)

## 2018-12-16 LAB — FERRITIN: Ferritin: 259.8 ng/mL (ref 22.0–322.0)

## 2018-12-16 LAB — VITAMIN D 25 HYDROXY (VIT D DEFICIENCY, FRACTURES): VITD: 28.28 ng/mL — ABNORMAL LOW (ref 30.00–100.00)

## 2018-12-16 LAB — IBC PANEL
Iron: 152 ug/dL (ref 42–165)
Saturation Ratios: 46 % (ref 20.0–50.0)
Transferrin: 236 mg/dL (ref 212.0–360.0)

## 2018-12-16 LAB — TESTOSTERONE: Testosterone: 373.13 ng/dL (ref 300.00–890.00)

## 2018-12-16 LAB — H. PYLORI ANTIBODY, IGG: H Pylori IgG: NEGATIVE

## 2018-12-16 LAB — PSA: PSA: 0.33 ng/mL (ref 0.10–4.00)

## 2018-12-16 LAB — SEDIMENTATION RATE: Sed Rate: 1 mm/hr (ref 0–15)

## 2018-12-16 LAB — URIC ACID: Uric Acid, Serum: 5.6 mg/dL (ref 4.0–7.8)

## 2018-12-16 LAB — LIPASE: Lipase: 20 U/L (ref 11.0–59.0)

## 2018-12-16 MED ORDER — VITAMIN D (ERGOCALCIFEROL) 1.25 MG (50000 UNIT) PO CAPS: 50000.0000 [IU] | ORAL_CAPSULE | ORAL | 1 refills | Status: DC

## 2018-12-16 NOTE — Assessment & Plan Note (Signed)
Spent significant time talking with patient again.  At this point we will add some laboratory work-up.  Patient could have a potential ankylosing spondylitis that could be slowing down the healing process and we will check vitamin D, iron, that could also be secondary to patient's new Lynch syndrome diagnosis since decreasing the absorption.  Patient has had psoriatic changes of the scalp and ANA to see if any autoimmune could be contributing.  Depending on findings this may change her medical management.  Patient will do a two-week trial of Prilosec to see if this helps any of the discomfort as well if there is any chance of gastritis that could be contributing.  Follow-up with me again to 3 weeks on a another WebEx visit

## 2018-12-17 ENCOUNTER — Telehealth: Payer: Self-pay

## 2018-12-17 LAB — ANA: Anti Nuclear Antibody (ANA): NEGATIVE

## 2018-12-17 LAB — HLA-B27 ANTIGEN: HLA-B27 Antigen: NEGATIVE

## 2018-12-17 LAB — PTH, INTACT AND CALCIUM
Calcium: 9.7 mg/dL (ref 8.6–10.3)
PTH: 41 pg/mL (ref 14–64)

## 2018-12-17 LAB — RHEUMATOID FACTOR: Rheumatoid fact SerPl-aCnc: <14 IU/mL (ref <14)

## 2018-12-17 NOTE — Telephone Encounter (Signed)
-----   Message from Renee A Kuneff, DO sent at 12/17/2018  8:23 AM EDT ----- Please inform patient the following information: His labs are all normal.  Normal urine, normal prostate screening, normal uric acid, normal liver and kidney function, normal electrolytes, normal blood counts.  The only lab mildly off was his vitamin D was 28, normal with 30 and desired is closer to 30-40.  Was on a higher dose prescription vitamin D, I would advise him to make sure he is using an over-the-counter 800-1000 units of vitamin D daily with a meal to keep his levels in normal range.  There are some other labs yet to result that Dr. Smith added on, they will be calling him once those results are completed. He should be getting a call from gastroenterology if he has not already and if he desires I will place a referral to urology (this is up to him since he had a normal urine and prostate screening-might be a good idea to establish if needing to be checked yearly with Lynch mutation) 

## 2018-12-17 NOTE — Telephone Encounter (Signed)
My Chart message sent to patient with results. Tried to call pt but VM was full

## 2018-12-23 ENCOUNTER — Ambulatory Visit: Payer: BLUE CROSS/BLUE SHIELD | Admitting: Family Medicine

## 2018-12-25 ENCOUNTER — Telehealth: Payer: Self-pay

## 2018-12-25 DIAGNOSIS — Z1509 Genetic susceptibility to other malignant neoplasm: Secondary | ICD-10-CM

## 2018-12-25 NOTE — Telephone Encounter (Signed)
Pt was called and given results. He would like a referral placed for urology  So he can go ahead and establish with them. He verbalized understanding on plan.

## 2018-12-25 NOTE — Telephone Encounter (Signed)
-----   Message from Natalia Leatherwood, DO sent at 12/17/2018  8:23 AM EDT ----- Please inform patient the following information: His labs are all normal.  Normal urine, normal prostate screening, normal uric acid, normal liver and kidney function, normal electrolytes, normal blood counts.  The only lab mildly off was his vitamin D was 28, normal with 30 and desired is closer to 30-40.  Was on a higher dose prescription vitamin D, I would advise him to make sure he is using an over-the-counter [phone] units of vitamin D daily with a meal to keep his levels in normal range.  There are some other labs yet to result that Dr. Katrinka Blazing added on, they will be calling him once those results are completed. He should be getting a call from gastroenterology if he has not already and if he desires I will place a referral to urology (this is up to him since he had a normal urine and prostate screening-might be a good idea to establish if needing to be checked yearly with Lynch mutation)

## 2018-12-25 NOTE — Addendum Note (Signed)
Addended by: Felix Pacini A on: 12/25/2018 03:01 PM   Modules accepted: Orders

## 2019-01-05 ENCOUNTER — Other Ambulatory Visit: Payer: Self-pay | Admitting: Family Medicine

## 2019-01-05 NOTE — Progress Notes (Signed)
[name] [place],  [address] Phone: [phone] Subjective:   Virtual Visit via Video Note  I connected with Paul [name] on 01/05/19 at  8:45 AM EDT by a video enabled telemedicine application and verified that I am speaking with the correct person using two identifiers.   I discussed the limitations of evaluation and management by telemedicine and the availability of in person appointments. The patient expressed understanding and agreed to proceed.  Patient is in home residence and I am in office setting for this virtual platform    I discussed the assessment and treatment plan with the patient. The patient was provided an opportunity to ask questions and all were answered. The patient agreed with the plan and demonstrated an understanding of the instructions.   The patient was advised to call back or seek an in-person evaluation if the symptoms worsen or if the condition fails to improve as anticipated.  I provided 16 minutes of face-to-face time during this encounter.   [name], DO    CC: Rib [name] follow-up  [id number]  Paul [name] is a 33 y.o. male coming in with complaint of rib [name].  Patient has had a rib [name] for some time.  He continued to have what appeared to be more of a polyarthralgia occurring.  Patient was to do a trial of PPI.  Laboratory work-up did show a low vitamin D.  Patient states still feels about the same.  Not getting any worse.  Not improving though.  Has not been taking the vitamin D level on a regular basis.  Doing the exercises occasionally.  Sitting more in his computer recently though feels that that has not been beneficial.     Past Medical History:  Diagnosis Date  . Anxiety   . Arthritis   . Back [name]    03/17/12 xray: minimal compression deformity L1. 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  . Chickenpox    childhood   . Family history of breast  cancer   . Family history of lung cancer   . Family history of Lynch syndrome   . Heart murmur    present since childhood, normal echo 2002  . IBS (irritable bowel syndrome) 2017   bloody stool x1  . Insomnia 2017  . Psoriasis of scalp    No past surgical history on file. Social History   Socioeconomic History  . Marital status: Married    Spouse name: Not on file  . Number of children: Not on file  . Years of education: Not on file  . Highest education level: Not on file  Occupational History  . Not on file  Social Needs  . Financial resource strain: Not on file  . Food insecurity:    Worry: Not on file    Inability: Not on file  . Transportation needs:    Medical: Not on file    Non-medical: Not on file  Tobacco Use  . Smoking status: Never Smoker  . Smokeless tobacco: Never Used  Substance and Sexual Activity  . Alcohol use: Yes    Comment: 3x a week.   . Drug use: No  . Sexual activity: Yes    Birth control/protection: None  Lifestyle  . Physical activity:    Days per week: Not on file    Minutes per session: Not on file  . Stress: Not on file  Relationships  . Social connections:    Talks on  phone: Not on file    Gets together: Not on file    Attends religious service: Not on file    Active member of club or organization: Not on file    Attends meetings of clubs or organizations: Not on file    Relationship status: Not on file  Other Topics Concern  . Not on file  Social History Narrative   Originally from  [place]. Married to [place].    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    Wears his seatbelt. Wears a bike helmet.    Exercises more than 3 times a week.    Has a smoke alarm the home.    Has firearms in the home.   No Known Allergies Family History  Problem Relation Age of Onset  . Alcohol abuse Mother        Lynch Syndrome- PMS2 mutation  . Lung cancer Father 32       nonsmoker  . Breast cancer Maternal  Grandmother 88  . Endometrial cancer Maternal Great-grandmother 70  . Ovarian cancer Maternal Great-grandmother 36  . Cancer Other 54       urothelial cancer  . Colon cancer Neg Hx   . Prostate cancer Neg Hx   . Pancreatic cancer Neg Hx   . Gastric cancer Neg Hx      Current Outpatient Medications (Cardiovascular):  .  nitroGLYCERIN (NITRODUR - DOSED IN MG/24 HR) 0.2 mg/hr patch, 1/4 patch daily (Patient not taking: Reported on 12/12/2018)     Current Outpatient Medications (Other):  Marland Kitchen  Vitamin D, Ergocalciferol, (DRISDOL) 1.25 MG (50000 UT) CAPS capsule, Take 1 capsule (50,000 Units total) by mouth every 7 (seven) days.    Past medical history, social, surgical and family history all reviewed in electronic medical record.  No pertanent information unless stated regarding to the chief complaint.   Review of Systems:  No headache, visual changes, nausea, vomiting, diarrhea, constipation, dizziness, abdominal [name], skin rash, fevers, chills, night sweats, weight loss, swollen lymph nodes, body aches, joint swelling,  chest [name], shortness of breath, mood changes.  Positive muscle aches  Objective     General: No apparent distress alert and oriented x3 mood and affect normal, dressed appropriately.  HEENT: Pupils equal, extraocular movements intact      Impression and Recommendations:     This case required medical decision making of moderate complexity. The above documentation has been reviewed and is accurate and complete [name], DO       Note: This dictation was prepared with Dragon dictation along with smaller phrase technology. Any transcriptional errors that result from this process are unintentional.

## 2019-01-06 ENCOUNTER — Encounter: Payer: Self-pay | Admitting: Family Medicine

## 2019-01-06 ENCOUNTER — Ambulatory Visit (INDEPENDENT_AMBULATORY_CARE_PROVIDER_SITE_OTHER): Payer: BLUE CROSS/BLUE SHIELD | Admitting: Family Medicine

## 2019-01-06 DIAGNOSIS — M9908 Segmental and somatic dysfunction of rib cage: Secondary | ICD-10-CM | POA: Diagnosis not present

## 2019-01-06 DIAGNOSIS — M940 Chondrocostal junction syndrome [Tietze]: Secondary | ICD-10-CM | POA: Diagnosis not present

## 2019-01-06 MED ORDER — VITAMIN D (ERGOCALCIFEROL) 1.25 MG (50000 UNIT) PO CAPS: 50000.0000 [IU] | ORAL_CAPSULE | ORAL | 1 refills | Status: DC

## 2019-01-06 NOTE — Assessment & Plan Note (Signed)
Discussed with patient again in great length.  Had response onto manipulation previously.  Will consider in the ultrasound again.  We discussed a CT scan would be the other possibility but with the coronavirus outbreak I do not think we will start this at the moment.  Patient will follow-up in person in 3 weeks.

## 2019-01-06 NOTE — Assessment & Plan Note (Signed)
Patient has the potential for the costochondritis or potentially a somatic rib dysfunction.

## 2019-01-15 ENCOUNTER — Other Ambulatory Visit: Payer: Self-pay

## 2019-01-15 ENCOUNTER — Ambulatory Visit (INDEPENDENT_AMBULATORY_CARE_PROVIDER_SITE_OTHER): Payer: BLUE CROSS/BLUE SHIELD | Admitting: Gastroenterology

## 2019-01-15 ENCOUNTER — Encounter: Payer: Self-pay | Admitting: Gastroenterology

## 2019-01-15 VITALS — Ht 76.0 in | Wt 200.0 lb

## 2019-01-15 DIAGNOSIS — Z1509 Genetic susceptibility to other malignant neoplasm: Secondary | ICD-10-CM | POA: Diagnosis not present

## 2019-01-15 NOTE — Progress Notes (Addendum)
This patient contacted our office requesting a physician telemedicine video consultation regarding clinical questions and/or test results.  If new patient, they were referred by PCP noted below  Participants on the conference : myself and patient   The patient consented to phone consultation and was aware that a charge will be placed through their insurance.  I was in my office and the patient was at home   Encounter time:  Total time 30 minutes, with 20 minutes spent with patient on Doximity   Paul Lund, MD   _____________________________________________________________________________________________              [facility] Consult Note:  History: Paul [name] 01/15/2019  Referring physician: Ma Hillock, DO  Reason for consult/chief complaint: Lynch syndrome (family history in mom ad sisiter of Lynch syndrome)   Subjective  HPI:  From recent primary care note by Dr. Raoul [name]: "Paul [name] is a 33 y.o. male present to discuss his genetic test findings.  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  The family history of Lynch syndrome in his mother, breast cancer in his maternal grandmother, endometrial/ovarian cancer in his maternal great-grandmother and a male relative on his mother side with urothelial cancer. "  Epigastric pain - PCP did H pylori Ab (neg).  Paul [name] reports that he has had about a year of frequent lower sternal nonexertional chest discomfort worse with certain movements or inspiration.  He has been told that is costochondritis.  He does not have upper abdominal pain, nausea, vomiting, early satiety, weight loss, dysphagia or odynophagia.  For many years bowel habits have been variable in consistency depending upon what he eats.  Also for years he has had occasional painless rectal bleeding.  It has not changed in frequency or character over that time.   ROS:  Review of Systems He denies  dysuria or dyspnea.   Past Medical History: Past Medical History:  Diagnosis Date  . Anxiety   . Arthritis   . Back pain    03/17/12 xray: minimal compression deformity L1. 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  . Chickenpox    childhood   . Family history of breast cancer   . Family history of lung cancer   . Family history of Lynch syndrome   . Heart murmur    present since childhood, normal echo 2002  . IBS (irritable bowel syndrome) 2017   bloody stool x1  . Insomnia 2017  . Psoriasis of scalp      Past Surgical History: Past Surgical History:  Procedure Laterality Date  . WISDOM TOOTH EXTRACTION       Family History: Family History  Problem Relation Age of Onset  . Alcohol abuse Mother   . Other Mother        Lynch syndrome- PMS2 nutation   . Lung cancer Father 98       nonsmoker  . Breast cancer Maternal Grandmother 61  . Endometrial cancer Maternal Great-grandmother 27  . Ovarian cancer Maternal Great-grandmother 33  . Cancer Other 54       urothelial cancer  . Other Sister        Lynch syndrome   . Colon cancer Neg Hx   . Prostate cancer Neg Hx   . Pancreatic cancer Neg Hx   . Gastric cancer Neg Hx   . Stomach cancer Neg Hx     Social History: Social History   Socioeconomic History  . Marital status: Married  Spouse name: Not on file  . Number of children: Not on file  . Years of education: Not on file  . Highest education level: Not on file  Occupational History  . Not on file  Social Needs  . Financial resource strain: Not on file  . Food insecurity:    Worry: Not on file    Inability: Not on file  . Transportation needs:    Medical: Not on file    Non-medical: Not on file  Tobacco Use  . Smoking status: Never Smoker  . Smokeless tobacco: Never Used  Substance and Sexual Activity  . Alcohol use: Yes    Comment: 3x a week.   . Drug use: No  . Sexual activity: Yes    Birth control/protection: None   Lifestyle  . Physical activity:    Days per week: Not on file    Minutes per session: Not on file  . Stress: Not on file  Relationships  . Social connections:    Talks on phone: Not on file    Gets together: Not on file    Attends religious service: Not on file    Active member of club or organization: Not on file    Attends meetings of clubs or organizations: Not on file    Relationship status: Not on file  Other Topics Concern  . Not on file  Social History Narrative   Originally from  [place]. Married to Paul [name].    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    Wears his seatbelt. Wears a bike helmet.    Exercises more than 3 times a week.    Has a smoke alarm the home.    Has firearms in the home.    Allergies: No Known Allergies  Outpatient Meds: Current Outpatient Medications  Medication Sig Dispense Refill  . nitroGLYCERIN (NITRODUR - DOSED IN MG/24 HR) 0.2 mg/hr patch 1/4 patch daily (Patient not taking: Reported on 12/12/2018) 30 patch 1  . Vitamin D, Ergocalciferol, (DRISDOL) 1.25 MG (50000 UT) CAPS capsule Take 1 capsule (50,000 Units total) by mouth every 7 (seven) days. 4 capsule 1   No current facility-administered medications for this visit.       ___________________________________________________________________ Objective   No exam - virtual visit  Labs:  Genetic counseling notes reviewed.  Initial testing was equivocal for PMS2 gene, but repeat testing confirmed this mutation.  H. pylori serum IgG antibody negative on 12/16/2018  Assessment: Encounter Diagnosis  Name Primary?  Marland Kitchen PMS2-related Lynch syndrome (HNPCC4) Yes    Years of what sounds like benign anorectal bleeding. Lynch mutation, increases risk of multiple malignancies including colon and, to a lesser extent, and a upper GI. We discussed current guidelines to start colonoscopy and upper endoscopy, with subsequent colonoscopy every 1 to 2 years and upper  perhaps every 3 to 5 years depending upon findings.   Plan:  We hope to soon have a phased expansion of endoscopy and get back to routine procedures within the next month, depending upon covered infections in our area.  We will put him on our list for colonoscopy and upper endoscopy in July of this year.  Procedure was described and he is agreeable.  Rectal bleeding Paul be evaluated at that time.  Thank you for the courtesy of this consult.  Please call me with any questions or concerns.  Nelida Meuse III  CC: Referring provider noted above

## 2019-01-26 NOTE — Progress Notes (Signed)
Paul [name] Paul [name] [place], [place] [address] Phone: [phone] Subjective:   I Paul [name] am serving as a Education administrator for Dr. Hulan Saas.     CC: Rib pain follow-up PIR:[id number]  Paul [name] is a 33 y.o. male coming in with complaint of rib pain. States that he is doing well. Feels about the same. Patient has been making some strides.  States maybe it is not as severe.  Maybe not on a daily basis.  Patient feels like he is making some small progress.  Feels like it did get better after the manipulation.      Past Medical History:  Diagnosis Date  . Anxiety   . Arthritis   . Back pain    03/17/12 xray: minimal compression deformity L1. 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  . Chickenpox    childhood   . Family history of breast cancer   . Family history of lung cancer   . Family history of Lynch syndrome   . Heart murmur    present since childhood, normal echo 2002  . IBS (irritable bowel syndrome) 2017   bloody stool x1  . Insomnia 2017  . Psoriasis of scalp    Past Surgical History:  Procedure Laterality Date  . WISDOM TOOTH EXTRACTION     Social History   Socioeconomic History  . Marital status: Married    Spouse name: Not on file  . Number of children: Not on file  . Years of education: Not on file  . Highest education level: Not on file  Occupational History  . Not on file  Social Needs  . Financial resource strain: Not on file  . Food insecurity:    Worry: Not on file    Inability: Not on file  . Transportation needs:    Medical: Not on file    Non-medical: Not on file  Tobacco Use  . Smoking status: Never Smoker  . Smokeless tobacco: Never Used  Substance and Sexual Activity  . Alcohol use: Yes    Comment: 3x a week.   . Drug use: No  . Sexual activity: Yes    Birth control/protection: None  Lifestyle  . Physical activity:    Days per week: Not on file    Minutes per session:  Not on file  . Stress: Not on file  Relationships  . Social connections:    Talks on phone: Not on file    Gets together: Not on file    Attends religious service: Not on file    Active member of club or organization: Not on file    Attends meetings of clubs or organizations: Not on file    Relationship status: Not on file  Other Topics Concern  . Not on file  Social History Narrative   Originally from  [place]. Married to Paul [name].    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    Wears his seatbelt. Wears a bike helmet.    Exercises more than 3 times a week.    Has a smoke alarm the home.    Has firearms in the home.   No Known Allergies Family History  Problem Relation Age of Onset  . Alcohol abuse Mother   . Other Mother        Lynch syndrome- PMS2 nutation   . Lung cancer Father 4       nonsmoker  . Breast cancer  Maternal Grandmother 24  . Endometrial cancer Maternal Great-grandmother 39  . Ovarian cancer Maternal Great-grandmother 44  . Cancer Other 54       urothelial cancer  . Other Sister        Lynch syndrome   . Colon cancer Neg Hx   . Prostate cancer Neg Hx   . Pancreatic cancer Neg Hx   . Gastric cancer Neg Hx   . Stomach cancer Neg Hx      Current Outpatient Medications (Cardiovascular):  .  nitroGLYCERIN (NITRODUR - DOSED IN MG/24 HR) 0.2 mg/hr patch, 1/4 patch daily     Current Outpatient Medications (Other):  Marland Kitchen  Vitamin D, Ergocalciferol, (DRISDOL) 1.25 MG (50000 UT) CAPS capsule, Take 1 capsule (50,000 Units total) by mouth every 7 (seven) days. .  Diclofenac Sodium (PENNSAID) 2 % SOLN, Place 2 g onto the skin 2 (two) times daily.    Past medical history, social, surgical and family history all reviewed in electronic medical record.  No pertanent information unless stated regarding to the chief complaint.   Review of Systems:  No headache, visual changes, nausea, vomiting, diarrhea, constipation, dizziness, abdominal  pain, skin rash, fevers, chills, night sweats, weight loss, swollen lymph nodes, body aches, joint swelling, muscle aches, chest pain, shortness of breath, mood changes.   Objective  Blood pressure (!) 140/92, pulse 77, height '6\' 4"'$  (1.93 m), weight 204 lb (92.5 kg), SpO2 97 %.   General: No apparent distress alert and oriented x3 mood and affect normal, dressed appropriately.  HEENT: Pupils equal, extraocular movements intact  Respiratory: Patient's speak in full sentences and does not appear short of breath  Cardiovascular: No lower extremity edema, non tender, no erythema  Skin: Warm dry intact with no signs of infection or rash on extremities or on axial skeleton.  Abdomen: Soft nontender  Neuro: Cranial nerves II through XII are intact, neurovascularly intact in all extremities with 2+ DTRs and 2+ pulses.  Lymph: No lymphadenopathy of posterior or anterior cervical chain or axillae bilaterally.  Gait normal with good balance and coordination.  MSK:  Non tender with full range of motion and good stability and symmetric strength and tone of shoulders, elbows, wrist, hip, knee and ankles bilaterally.  Patient had chest exam on the left rib area still has some discomfort.  No true mass appreciated.  No crepitus with deep breathing. Back Exam:  Inspection: Mild loss of lordosis Motion: Flexion 40 deg, Extension 25 deg, Side Bending to 35 deg bilaterally,  Rotation to 45 deg bilaterally  SLR laying: Negative  XSLR laying: Negative  Palpable tenderness: Tender to palpation the paraspinal musculature lumbar spine right greater than left. FABER: Positive Faber bilaterally. Sensory change: Gross sensation intact to all lumbar and sacral dermatomes.   Reflexes: 2+ at both patellar tendons, 2+ at achilles tendons, Babinski's downgoing.  Strength at foot  Plantar-flexion: 5/5 Dorsi-flexion: 5/5 Eversion: 5/5 Inversion: 5/5  Leg strength  Quad: 5/5 Hamstring: 5/5 Hip flexor: 5/5 Hip abductors:  5/5  Gait unremarkable.  Osteopathic findings  C2 flexed rotated and side bent right C6 flexed rotated and side bent left T3 extended rotated and side bent left inhaled third rib T9 extended rotated and side bent left L2 flexed rotated and side bent right Sacrum right on right    Impression and Recommendations:     This case required medical decision making of moderate complexity. The above documentation has been reviewed and is accurate and complete [name], DO  Note: This dictation was prepared with Dragon dictation along with smaller phrase technology. Any transcriptional errors that result from this process are unintentional.

## 2019-01-27 ENCOUNTER — Ambulatory Visit: Payer: BLUE CROSS/BLUE SHIELD | Admitting: Family Medicine

## 2019-01-27 ENCOUNTER — Other Ambulatory Visit: Payer: Self-pay

## 2019-01-27 ENCOUNTER — Encounter: Payer: Self-pay | Admitting: Family Medicine

## 2019-01-27 DIAGNOSIS — M940 Chondrocostal junction syndrome [Tietze]: Secondary | ICD-10-CM | POA: Diagnosis not present

## 2019-01-27 DIAGNOSIS — M999 Biomechanical lesion, unspecified: Secondary | ICD-10-CM | POA: Diagnosis not present

## 2019-01-27 DIAGNOSIS — M9908 Segmental and somatic dysfunction of rib cage: Secondary | ICD-10-CM

## 2019-01-27 MED ORDER — DICLOFENAC SODIUM 2 % TD SOLN: 2.0000 g | Freq: Two times a day (BID) | TRANSDERMAL | 3 refills | Status: DC

## 2019-01-27 NOTE — Patient Instructions (Addendum)
Great to see you  Paul [name] is your friend You are making progress  Do ice at night pennsaid pinkie amount topically 2 times daily as needed.  See me again in 6 weeks

## 2019-01-27 NOTE — Assessment & Plan Note (Signed)
Patient continues have some discomfort over the rib area itself.  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  Discussed the nitroglycerin out and given some topical anti-inflammatories.  Normally patient should do relatively well.  Attempted osteopathic manipulation again hopefully will be beneficial.  Follow-up again 6 weeks

## 2019-01-27 NOTE — Assessment & Plan Note (Signed)
Decision today to treat with OMT was based on Physical Exam  After verbal consent patient was treated with HVLA, ME, FPR techniques in cervical, thoracic rib and sacral areas  Patient tolerated the procedure well with improvement in symptoms  Patient given exercises, stretches and lifestyle modifications  See medications in patient instructions if given  Patient will follow up in 6 weeks

## 2019-03-08 NOTE — Progress Notes (Signed)
[name], [place] [address] Phone: [phone] Subjective:   I Paul [name] am serving as a Education administrator for Dr. Hulan Saas.   CC: Neck and back pain follow-up  WHQ:[id number]  Paul [name] is a 33 y.o. male coming in with complaint of chest pain. Doesn't believe he has made any progress from his last visit.   Patient has had this pain for many months at this time.  Seem to have more of a stress reaction and intercostal injury.  Patient states that it seems to come and go.  Has days where she feels a decent and another days when he seems to be worsening.  Recently diagnosed with Lynch syndrome and patient is concerned that there could be a potential tumor that could be contributing.  Patient states sometimes seems to get worse with eating known.  Has a follow-up with gastroenterology in the near future.  Feels that the osteopathic manipulation has not been helping.  States that unfortunately some other difficulties with aching pains here and there of multiple joints.     Past Medical History:  Diagnosis Date  . Anxiety   . Arthritis   . Back pain    03/17/12 xray: minimal compression deformity L1. 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  . Chickenpox    childhood   . Family history of breast cancer   . Family history of lung cancer   . Family history of Lynch syndrome   . Heart murmur    present since childhood, normal echo 2002  . IBS (irritable bowel syndrome) 2017   bloody stool x1  . Insomnia 2017  . Psoriasis of scalp    Past Surgical History:  Procedure Laterality Date  . WISDOM TOOTH EXTRACTION     Social History   Socioeconomic History  . Marital status: Married    Spouse name: Not on file  . Number of children: Not on file  . Years of education: Not on file  . Highest education level: Not on file  Occupational History  . Not on file  Social Needs  . Financial resource strain: Not on  file  . Food insecurity    Worry: Not on file    Inability: Not on file  . Transportation needs    Medical: Not on file    Non-medical: Not on file  Tobacco Use  . Smoking status: Never Smoker  . Smokeless tobacco: Never Used  Substance and Sexual Activity  . Alcohol use: Yes    Comment: 3x a week.   . Drug use: No  . Sexual activity: Yes    Birth control/protection: None  Lifestyle  . Physical activity    Days per week: Not on file    Minutes per session: Not on file  . Stress: Not on file  Relationships  . Social Herbalist on phone: Not on file    Gets together: Not on file    Attends religious service: Not on file    Active member of club or organization: Not on file    Attends meetings of clubs or organizations: Not on file    Relationship status: Not on file  Other Topics Concern  . Not on file  Social History Narrative   Originally from  [place]. Married to Paul [name].    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    Wears his seatbelt. Wears a bike  helmet.    Exercises more than 3 times a week.    Has a smoke alarm the home.    Has firearms in the home.   No Known Allergies Family History  Problem Relation Age of Onset  . Alcohol abuse Mother   . Other Mother        Lynch syndrome- PMS2 nutation   . Lung cancer Father 29       nonsmoker  . Breast cancer Maternal Grandmother 84  . Endometrial cancer Maternal Great-grandmother 18  . Ovarian cancer Maternal Great-grandmother 2  . Cancer Other 54       urothelial cancer  . Other Sister        Lynch syndrome   . Colon cancer Neg Hx   . Prostate cancer Neg Hx   . Pancreatic cancer Neg Hx   . Gastric cancer Neg Hx   . Stomach cancer Neg Hx      Current Outpatient Medications (Cardiovascular):  .  nitroGLYCERIN (NITRODUR - DOSED IN MG/24 HR) 0.2 mg/hr patch, 1/4 patch daily     Current Outpatient Medications (Other):  Marland Kitchen  Diclofenac Sodium (PENNSAID) 2 % SOLN, Place  2 g onto the skin 2 (two) times daily. .  Vitamin D, Ergocalciferol, (DRISDOL) 1.25 MG (50000 UT) CAPS capsule, Take 1 capsule (50,000 Units total) by mouth every 7 (seven) days.    Past medical history, social, surgical and family history all reviewed in electronic medical record.  No pertanent information unless stated regarding to the chief complaint.   Review of Systems:  No headache, visual changes, nausea, vomiting, diarrhea, constipation, dizziness, abdominal pain, skin rash, fevers, chills, night sweats, weight loss, swollen lymph nodes,, chest pain, shortness of breath, mood changes.  Positive muscle aches and body aches  Objective  Blood pressure 100/82, pulse 78, height '6\' 4"'  (1.93 m), SpO2 98 %.    General: No apparent distress alert and oriented x3 mood and affect normal, dressed appropriately.  HEENT: Pupils equal, extraocular movements intact  Respiratory: Patient's speak in full sentences and does not appear short of breath  Cardiovascular: No lower extremity edema, non tender, no erythema  Skin: Warm dry intact with no signs of infection or rash on extremities or on axial skeleton.  Abdomen: Soft nontender  Neuro: Cranial nerves II through XII are intact, neurovascularly intact in all extremities with 2+ DTRs and 2+ pulses.  Lymph: No lymphadenopathy of posterior or anterior cervical chain or axillae bilaterally.  Gait normal with good balance and coordination.  MSK:  Non tender with full range of motion and good stability and symmetric strength and tone of shoulders, elbows, wrist, hip, knee and ankles bilaterally.  Neck: Inspection loss of lordosis. No palpable stepoffs. Negative Spurling's maneuver. Full neck range of motion Grip strength and sensation normal in bilateral hands Strength good C4 to T1 distribution No sensory change to C4 to T1 Negative Hoffman sign bilaterally Reflexes normal Tightness of the trapezius bilaterally Patient sternum does have some  discomfort and diffusely right greater than left.  Seems to be very distinct around the T3-T5 on the right side.  A little different presentation than previous exam.  Back Exam:  Inspection: Unremarkable  Motion: Flexion 45 deg, Extension 35 deg, Side Bending to 35 deg bilaterally,  Rotation to 45 deg bilaterally  SLR laying: Negative  XSLR laying: Negative  Palpable tenderness: Tender to palpation the paraspinal musculature lumbar spine right greater than left. FABER: negative. Sensory change: Gross sensation intact to all  lumbar and sacral dermatomes.  Reflexes: 2+ at both patellar tendons, 2+ at achilles tendons, Babinski's downgoing.  Strength at foot  Plantar-flexion: 5/5 Dorsi-flexion: 5/5 Eversion: 5/5 Inversion: 5/5  Leg strength  Quad: 5/5 Hamstring: 5/5 Hip flexor: 5/5 Hip abductors: 5/5  Gait unremarkable.  Osteopathic findings  C6 flexed rotated and side bent left T3 extended rotated and side bent right inhaled third rib     Impression and Recommendations:     This case required medical decision making of moderate complexity. The above documentation has been reviewed and is accurate and complete [name], DO       Note: This dictation was prepared with Dragon dictation along with smaller phrase technology. Any transcriptional errors that result from this process are unintentional.

## 2019-03-10 ENCOUNTER — Encounter: Payer: Self-pay | Admitting: Family Medicine

## 2019-03-10 ENCOUNTER — Other Ambulatory Visit: Payer: Self-pay

## 2019-03-10 ENCOUNTER — Ambulatory Visit (INDEPENDENT_AMBULATORY_CARE_PROVIDER_SITE_OTHER): Payer: BC Managed Care – PPO | Admitting: Family Medicine

## 2019-03-10 DIAGNOSIS — R079 Chest pain, unspecified: Secondary | ICD-10-CM

## 2019-03-10 DIAGNOSIS — M940 Chondrocostal junction syndrome [Tietze]: Secondary | ICD-10-CM

## 2019-03-10 NOTE — Patient Instructions (Addendum)
Good to see you  Will schedule MRI [phone] Ask GI doc about EGD

## 2019-03-10 NOTE — Assessment & Plan Note (Signed)
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  Patient has failed all conservative therapy including topical anti-inflammatories, home exercises and formal physical therapy, vitamin D supplementation, nitroglycerin as well for the possibility of a stress reaction.  Patient is making very minimal benefits.  Recent diagnosis of Lynch syndrome.  We discussed other comorbidities that contribute with that.  Patient does have an increased risk of stomach or biliary tumors.  Patient is going to be following up with GI and encouraged the possibility of an EGD.  Patient's pain now seems to come and go and likely a tumor is unrealistic.  At this point do not patient is highly anxious and feels like he is worsening an MRI sternum will be ordered.  This will help [place] with a little more of the soft tissue association and could be contributing and truly just the bone aspect.  Depending on imaging this would not change medical management.  Patient is in agreement with the plan.  Spent  25 minutes with patient face-to-face and had greater than 50% of counseling including as described above in assessment and plan.

## 2019-03-12 DIAGNOSIS — Z125 Encounter for screening for malignant neoplasm of prostate: Secondary | ICD-10-CM | POA: Diagnosis not present

## 2019-04-14 ENCOUNTER — Ambulatory Visit
Admission: RE | Admit: 2019-04-14 | Discharge: 2019-04-14 | Disposition: A | Discharge status: Home / Self Care | Payer: BLUE CROSS/BLUE SHIELD | Source: Ambulatory Visit | Attending: Family Medicine | Admitting: Family Medicine

## 2019-04-14 ENCOUNTER — Other Ambulatory Visit: Payer: Self-pay

## 2019-04-14 DIAGNOSIS — R079 Chest pain, unspecified: Secondary | ICD-10-CM

## 2019-04-15 ENCOUNTER — Encounter: Payer: Self-pay | Admitting: Gastroenterology

## 2019-04-15 ENCOUNTER — Telehealth: Payer: Self-pay | Admitting: Gastroenterology

## 2019-04-15 NOTE — Telephone Encounter (Signed)
Thank you for the note.  Yes, he should be schedule for an EGD and colonoscopy with me in the [facility].  Indication: Lynch syndrome

## 2019-04-15 NOTE — Telephone Encounter (Signed)
Per your office note on 01/15/2019, this patient was to have an endo/colon in July. This patient is calling to schedule. Is this still the plan for this patient? Please advise.

## 2019-04-15 NOTE — Telephone Encounter (Signed)
Pt had OV in May and called to schedule a colonoscopy.  I don't see this in previous OV.  Please advise.

## 2019-04-17 ENCOUNTER — Other Ambulatory Visit: Payer: Self-pay

## 2019-04-17 DIAGNOSIS — Z20822 Contact with and (suspected) exposure to covid-19: Secondary | ICD-10-CM

## 2019-04-19 LAB — NOVEL CORONAVIRUS, NAA: SARS-CoV-2, NAA: NOT DETECTED

## 2019-04-26 NOTE — Progress Notes (Signed)
[name] [place], [place] [address] Phone: [phone] Subjective:   Paul [name], am serving as a scribe for Dr. Hulan Saas.      CC: Sternum pain follow-up  TIR:[id number]  Paul [name] is a 33 y.o. male coming in with complaint of left chest pain. Patient states that he has not been doing anything for the pain. Constant pain.      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  Prominent soft tissue within the anterior mediastinum seem to be more of a remnant of the thymus encourage CT scan chest with contrast in 3 months.  Past Medical History:  Diagnosis Date  . Anxiety   . Arthritis    Costocgondritis  . Back pain    03/17/12 xray: minimal compression deformity L1. 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  . Chickenpox    childhood   . Family history of breast cancer   . Family history of lung cancer   . Family history of Lynch syndrome   . Heart murmur    present since childhood, normal echo 2002  . IBS (irritable bowel syndrome) 2017   bloody stool x1  . Insomnia 2017  . Psoriasis of scalp    Past Surgical History:  Procedure Laterality Date  . WISDOM TOOTH EXTRACTION     Social History   Socioeconomic History  . Marital status: Married    Spouse name: Not on file  . Number of children: Not on file  . Years of education: Not on file  . Highest education level: Not on file  Occupational History  . Not on file  Social Needs  . Financial resource strain: Not on file  . Food insecurity    Worry: Not on file    Inability: Not on file  . Transportation needs    Medical: Not on file    Non-medical: Not on file  Tobacco Use  . Smoking status: Never Smoker  . Smokeless tobacco: Never Used  Substance and Sexual Activity  . Alcohol use: Not Currently    Comment: 3x a week.   . Drug use: [name]  . Sexual  activity: Yes    Birth control/protection: None  Lifestyle  . Physical activity    Days per week: Not on file    Minutes per session: Not on file  . Stress: Not on file  Relationships  . Social Herbalist on phone: Not on file    Gets together: Not on file    Attends religious service: Not on file    Active member of club or organization: Not on file    Attends meetings of clubs or organizations: Not on file    Relationship status: Not on file  Other Topics Concern  . Not on file  Social History Narrative   Originally from  [place]. Married to [place].    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 [name] children. 1 dog.    Wears his seatbelt. Wears a bike helmet.    Exercises more than 3 times a week.    Has a smoke alarm the home.    Has firearms in the home.   [name] Known Allergies Family History  Problem Relation Age of Onset  . Alcohol abuse Mother   . Other Mother        Lynch syndrome- PMS2 nutation   .  Lung cancer Father 63       nonsmoker  . Breast cancer Maternal Grandmother 64  . Endometrial cancer Maternal Great-grandmother 71  . Ovarian cancer Maternal Great-grandmother 83  . Cancer Other 54       urothelial cancer  . Other Sister        Lynch syndrome   . Colon cancer Neg Hx   . Prostate cancer Neg Hx   . Pancreatic cancer Neg Hx   . Gastric cancer Neg Hx   . Stomach cancer Neg Hx   . Esophageal cancer Neg Hx      Current Outpatient Medications (Cardiovascular):  .  nitroGLYCERIN (NITRODUR - DOSED IN MG/24 HR) 0.2 mg/hr patch, 1/4 patch daily (Patient not taking: Reported on 04/28/2019)     Current Outpatient Medications (Other):  Marland Kitchen  Diclofenac Sodium (PENNSAID) 2 % SOLN, Place 2 g onto the skin 2 (two) times daily. Marland Kitchen  doxycycline (VIBRA-TABS) 100 MG tablet, Take 1 tablet (100 mg total) by mouth 2 (two) times daily. Take 1 tablet 2 times a day for 1 week .  PEG-KCl-NaCl-NaSulf-Na Asc-C (PLENVU) 140 g SOLR, Take 1 kit by mouth once for 1  dose. .  Vitamin D, Ergocalciferol, (DRISDOL) 1.25 MG (50000 UT) CAPS capsule, Take 1 capsule (50,000 Units total) by mouth every 7 (seven) days.    Past medical history, social, surgical and family history all reviewed in electronic medical record.  [name] pertanent information unless stated regarding to the chief complaint.   Review of Systems:  [name] headache, visual changes, nausea, vomiting, diarrhea, constipation, dizziness, abdominal pain, skin rash, fevers, chills, night sweats, weight loss, swollen lymph nodes, body aches, joint swelling, muscle aches, chest pain, shortness of breath, mood changes.   Objective  Blood pressure 120/64, pulse 63, height _0  (1.93 m), SpO2 98 %.   General: [name] apparent distress alert and oriented x3 mood and affect normal, dressed appropriately.  HEENT: Pupils equal, extraocular movements intact  Respiratory: Patient's speak in full sentences and does not appear short of breath  Cardiovascular: [name] lower extremity edema, non tender, [name] erythema  Skin: Warm dry intact with [name] signs of infection or rash on extremities or on axial skeleton.  Abdomen: Soft nontender  Neuro: Cranial nerves II through XII are intact, neurovascularly intact in all extremities with 2+ DTRs and 2+ pulses.  Lymph: [name] lymphadenopathy of posterior or anterior cervical chain or axillae bilaterally.  Gait normal with good balance and coordination.  MSK:  Non tender with full range of motion and good stability and symmetric strength and tone of  elbows, wrist, hip, knee and ankles bilaterally.  Patient's right sternum still shows the patient has significant swelling compared to the contralateral side mostly around the sternoclavicular joint.  Tender to palpation.  Procedure: Real-time Ultrasound Guided Injection of left second rib sternal costosternal junction Device: GE Logiq Q7 Ultrasound guided injection is preferred based studies that show increased duration, increased effect, greater  accuracy, decreased procedural pain, increased response rate, and decreased cost with ultrasound guided versus blind injection.  Verbal informed consent obtained.  Time-out conducted.  Noted [name] overlying erythema, induration, or other signs of local infection.  Skin prepped in a sterile fashion.  Local anesthesia: Topical Ethyl chloride.  With sterile technique and under real time ultrasound guidance: With a 25-gauge half inch needle injected with 0.5 cc of 0.5% Marcaine and 0.5 cc of Kenalog 40 mg/mL Completed without difficulty  Pain immediately resolved suggesting accurate placement of the  medication.  Advised to call if fevers/chills, erythema, induration, drainage, or persistent bleeding.  Images permanently stored and available for review in the ultrasound unit.  Impression: Technically successful ultrasound guided injection.   Impression and Recommendations:     This case required medical decision making of moderate complexity. The above documentation has been reviewed and is accurate and complete [name], DO       Note: This dictation was prepared with Dragon dictation along with smaller phrase technology. Any transcriptional errors that result from this process are unintentional.

## 2019-04-28 ENCOUNTER — Ambulatory Visit: Payer: BC Managed Care – PPO | Admitting: Family Medicine

## 2019-04-28 ENCOUNTER — Other Ambulatory Visit: Payer: Self-pay

## 2019-04-28 ENCOUNTER — Ambulatory Visit: Payer: Self-pay

## 2019-04-28 ENCOUNTER — Encounter: Payer: Self-pay | Admitting: Family Medicine

## 2019-04-28 ENCOUNTER — Ambulatory Visit (AMBULATORY_SURGERY_CENTER): Payer: Self-pay | Admitting: *Deleted

## 2019-04-28 VITALS — Temp 97.1°F | Ht 76.0 in | Wt 200.0 lb

## 2019-04-28 VITALS — BP 120/64 | HR 63 | Ht 76.0 in

## 2019-04-28 DIAGNOSIS — Z1509 Genetic susceptibility to other malignant neoplasm: Secondary | ICD-10-CM

## 2019-04-28 DIAGNOSIS — M25512 Pain in left shoulder: Secondary | ICD-10-CM | POA: Diagnosis not present

## 2019-04-28 DIAGNOSIS — G8929 Other chronic pain: Secondary | ICD-10-CM | POA: Diagnosis not present

## 2019-04-28 DIAGNOSIS — M940 Chondrocostal junction syndrome [Tietze]: Secondary | ICD-10-CM | POA: Diagnosis not present

## 2019-04-28 MED ORDER — PLENVU 140 G PO SOLR: 1.0000 | Freq: Once | ORAL | 0 refills | Status: AC

## 2019-04-28 MED ORDER — DOXYCYCLINE HYCLATE 100 MG PO TABS: 100.0000 mg | ORAL_TABLET | Freq: Two times a day (BID) | ORAL | 0 refills | Status: DC

## 2019-04-28 NOTE — Assessment & Plan Note (Signed)
Patient had the MRI that did show some swelling more of the [place] joint.  Patient though on exam today and on ultrasound today did not show any significant swelling there is more pain over the second sternocostal area.  Patient is to increase activity slowly.  Patient will avoid any heavy lifting.  Patient knows if any worsening symptoms patient is going to increase activity as tolerated.  Patient is worsening symptoms to call us immediately.  Due to the soft tissue changes patient will follow-up again in 4 to 8 weeks

## 2019-04-28 NOTE — Progress Notes (Signed)
Patient is here for PV today. Patient denies any allergies to eggs or soy. Patient denies any problems with anesthesia/sedation. Patient denies any oxygen use at home. Patient denies taking any diet/weight loss medications or blood thinners. Plenvu coupon given to pt. EMMI education assisgned to patient on EGD&colonoscopy, this was explained and instructions given to patient.Pt is aware that care partner will wait in the car during procedure; if they feel like they will be too hot to wait in the car; they may wait in the lobby.  We want them to wear a mask (we do not have any that we can provide them), practice social distancing, and we will check their temperatures when they get here.  I did remind patient that their care partner needs to stay in the parking lot the entire time. Pt will wear mask into building.

## 2019-04-28 NOTE — Patient Instructions (Signed)
Good to see you  Tried an injection  Ice is your friend Stay active Doxycycline 2 times a day for 7 days  Send a message in 2 weeks See me again in 6 weeks

## 2019-05-06 ENCOUNTER — Encounter: Payer: Self-pay | Admitting: Gastroenterology

## 2019-05-09 ENCOUNTER — Telehealth: Payer: Self-pay | Admitting: Gastroenterology

## 2019-05-09 NOTE — Telephone Encounter (Signed)
Patient answered "NO" to all covid-19 questions °

## 2019-05-09 NOTE — Telephone Encounter (Signed)
Covid-19 screening questions °  °  °Do you now or have you had a fever in the last 14 days? °  °Do you have any respiratory symptoms of shortness of breath or cough now or in the last 14 days? °  °Do you have any family members or close contacts with diagnosed or suspected Covid-19 in the past 14 days? °  °Have you been tested for Covid-19 and found to be positive? ° °Please advise pt to check in on the 4th floor and that care partner may wait in the car or come up to the lobby during procedure. Also make aware that both must enter the building wearing a mask. °

## 2019-05-12 ENCOUNTER — Ambulatory Visit (AMBULATORY_SURGERY_CENTER): Payer: BC Managed Care – PPO | Admitting: Gastroenterology

## 2019-05-12 ENCOUNTER — Encounter: Payer: Self-pay | Admitting: Gastroenterology

## 2019-05-12 ENCOUNTER — Other Ambulatory Visit: Payer: Self-pay

## 2019-05-12 VITALS — BP 123/66 | HR 60 | Temp 98.1°F | Resp 20 | Ht 76.0 in | Wt 200.0 lb

## 2019-05-12 DIAGNOSIS — Z1509 Genetic susceptibility to other malignant neoplasm: Secondary | ICD-10-CM | POA: Diagnosis not present

## 2019-05-12 DIAGNOSIS — Z1211 Encounter for screening for malignant neoplasm of colon: Secondary | ICD-10-CM | POA: Diagnosis not present

## 2019-05-12 MED ORDER — SODIUM CHLORIDE 0.9 % IV SOLN: 500.0000 mL | Freq: Once | INTRAVENOUS | Status: DC

## 2019-05-12 NOTE — Op Note (Signed)
[facility] Patient Name: Paul [name] Procedure Date: 05/12/2019 11:04 AM MRN: [id number] Endoscopist: Henry L. 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 The patient's tolerance of                            previous anesthesia was also reviewed. The risks                            and benefits of the procedure and the sedation                            options and risks were discussed with the patient.                            All questions were answered, and informed consent                            was obtained. Prior Anticoagulants: The patient has                            taken no previous anticoagulant or antiplatelet                            agents. ASA Grade Assessment: II - A patient with                            mild systemic disease. After reviewing the risks                            and benefits, the patient was deemed in                            satisfactory condition to undergo the procedure.                             After obtaining informed consent, the endoscope was                            passed under direct vision. Throughout the                            procedure, the patient's blood pressure, pulse, and                            oxygen saturations were monitored continuously. The                            Endoscope was introduced through the mouth, and                            advanced to the second part of  duodenum. The upper                            GI endoscopy was accomplished without difficulty.                            The patient tolerated the procedure well. Scope In: Scope Out: Findings:                 The esophagus was normal.                           The stomach was normal.                           The cardia and gastric fundus were normal on                            retroflexion.                           The examined duodenum was normal. Complications:            No immediate complications. Estimated Blood Loss:     Estimated blood loss: none. Impression:               - Normal esophagus.                           - Normal stomach.                           - Normal examined duodenum.                           - No specimens collected. Recommendation:           - Patient has a contact number available for                            emergencies. The signs and symptoms of potential                              delayed complications were discussed with the                            patient. Return to normal activities tomorrow.                            Written discharge instructions were provided to the                            patient.                           - Resume previous diet.                           - Continue present medications.                           - Repeat upper endoscopy in 3 years for screening                            purposes. Henry L. Danis, MD 05/12/2019 12:00:55 PM This report has been signed electronically. 

## 2019-05-12 NOTE — Patient Instructions (Signed)
YOU HAD AN ENDOSCOPIC PROCEDURE TODAY AT THE [facility]:   Refer to the procedure report that was given to you for any specific questions about what was found during the examination.  If the procedure report does not answer your questions, please call your gastroenterologist to clarify.  If you requested that your care partner not be given the details of your procedure findings, then the procedure report has been included in a sealed envelope for you to review at your convenience later.  YOU SHOULD EXPECT: Some feelings of bloating in the abdomen. Passage of more gas than usual.  Walking can help get rid of the air that was put into your GI tract during the procedure and reduce the bloating. If you had a lower endoscopy (such as a colonoscopy or flexible sigmoidoscopy) you may notice spotting of blood in your stool or on the toilet paper. If you underwent a bowel prep for your procedure, you may not have a normal bowel movement for a few days.  Please Note:  You might notice some irritation and congestion in your nose or some drainage.  This is from the oxygen used during your procedure.  There is no need for concern and it should clear up in a day or so.  SYMPTOMS TO REPORT IMMEDIATELY:   Following lower endoscopy (colonoscopy or flexible sigmoidoscopy):  Excessive amounts of blood in the stool  Significant tenderness or worsening of abdominal pains  Swelling of the abdomen that is new, acute  Fever of 100F or higher   Following upper endoscopy (EGD)  Vomiting of blood or coffee ground material  New chest pain or pain under the shoulder blades  Painful or persistently difficult swallowing  New shortness of breath  Fever of 100F or higher  Black, tarry-looking stools  For urgent or emergent issues, a gastroenterologist can be reached at any hour by calling [phone].   DIET:  We do recommend a small meal at first, but then you may proceed to your regular diet.  Drink  plenty of fluids but you should avoid alcoholic beverages for 24 hours.  ACTIVITY:  You should plan to take it easy for the rest of today and you should NOT DRIVE or use heavy machinery until tomorrow (because of the sedation medicines used during the test).    FOLLOW UP: Our staff will call the number listed on your records 48-72 hours following your procedure to check on you and address any questions or concerns that you may have regarding the information given to you following your procedure. If we do not reach you, we will leave a message.  We will attempt to reach you two times.  During this call, we will ask if you have developed any symptoms of COVID 19. If you develop any symptoms (ie: fever, flu-like symptoms, shortness of breath, cough etc.) before then, please call [phone].  If you test positive for Covid 19 in the 2 weeks post procedure, please call and report this information to us.    If any biopsies were taken you will be contacted by phone or by letter within the next 1-3 weeks.  Please call us at [phone] if you have not heard about the biopsies in 3 weeks.    SIGNATURES/CONFIDENTIALITY: You and/or your care partner have signed paperwork which will be entered into your electronic medical record.  These signatures attest to the fact that that the information above on your After Visit Summary has been reviewed and is   understood.  Full responsibility of the confidentiality of this discharge information lies with you and/or your care-partner. 

## 2019-05-12 NOTE — Progress Notes (Signed)
A/ox3, pleased with MAC, report to RN 

## 2019-05-12 NOTE — Progress Notes (Signed)
Pt's states no medical or surgical changes since previsit or office visit.  Temp taken by JB VS taken by [place] 

## 2019-05-12 NOTE — Op Note (Signed)
[facility] Patient Name: Paul [name] Procedure Date: 05/12/2019 11:04 AM MRN: [id number] Endoscopist: Sherilyn CooterHenry L. Myrtie Neitheranis , MD Age: [age] Referring MD:  Date of Birth: 03/22/1986 Gender: Male Account #: [id number] Procedure:                Colonoscopy Indications:              Lynch Syndrome (PSM-2) - first colonoscopy Medicines:                Monitored Anesthesia Care Procedure:                Pre-Anesthesia Assessment:                           - Prior to the procedure, a History and Physical                            was performed, and patient medications and                            allergies were reviewed. The patient's tolerance of                            previous anesthesia was also reviewed. The risks                            and benefits of the procedure and the sedation                            options and risks were discussed with the patient.                            All questions were answered, and informed consent                            was obtained. Prior Anticoagulants: The patient has                            taken no previous anticoagulant or antiplatelet                            agents. ASA Grade Assessment: II - A patient with                            mild systemic disease. After reviewing the risks                            and benefits, the patient was deemed in                            satisfactory condition to undergo the procedure.                           After obtaining informed consent, the colonoscope  was passed under direct vision. Throughout the                            procedure, the patient's blood pressure, pulse, and                            oxygen saturations were monitored continuously. The                            Colonoscope was introduced through the anus and                            advanced to the the cecum, identified by                            appendiceal orifice and  ileocecal valve. The                            colonoscopy was performed without difficulty. The                            patient tolerated the procedure well. The quality                            of the bowel preparation was excellent. The                            ileocecal valve, appendiceal orifice, and rectum                            were photographed. Scope In: 11:27:09 AM Scope Out: 11:41:03 AM Scope Withdrawal Time: 0 hours 11 minutes 31 seconds  Total Procedure Duration: 0 hours 13 minutes 54 seconds  Findings:                 The perianal and digital rectal examinations were                            normal.                           The entire examined colon appeared normal on direct                            and retroflexion views. Complications:            No immediate complications. Estimated Blood Loss:     Estimated blood loss: none. Impression:               - The entire examined colon is normal on direct and                            retroflexion views.                           - No specimens collected. Recommendation:           -  Patient has a contact number available for                            emergencies. The signs and symptoms of potential                            delayed complications were discussed with the                            patient. Return to normal activities tomorrow.                            Written discharge instructions were provided to the                            patient.                           - Resume previous diet.                           - Continue present medications.                           - Repeat colonoscopy in 18 months for screening                            purposes.                           - See the other procedure note (EGD) for                            documentation of additional recommendations. Sharanya Templin L. Myrtie Neither, MD 05/12/2019 11:54:42 AM This report has been signed electronically.

## 2019-05-14 ENCOUNTER — Telehealth: Payer: Self-pay | Admitting: *Deleted

## 2019-05-14 ENCOUNTER — Telehealth: Payer: Self-pay

## 2019-05-14 NOTE — Telephone Encounter (Signed)
Second attempt follow up call to pt, left message for pt to call if problems or covid 19 sx or exposure.

## 2019-05-14 NOTE — Telephone Encounter (Signed)
First attempt, left VM.  

## 2019-06-10 ENCOUNTER — Encounter: Payer: Self-pay | Admitting: Family Medicine

## 2019-06-10 ENCOUNTER — Ambulatory Visit: Payer: BC Managed Care – PPO | Admitting: Family Medicine

## 2019-06-10 ENCOUNTER — Other Ambulatory Visit: Payer: Self-pay

## 2019-06-10 ENCOUNTER — Ambulatory Visit: Payer: Self-pay

## 2019-06-10 VITALS — BP 110/66 | HR 63 | Ht 76.0 in | Wt 195.0 lb

## 2019-06-10 DIAGNOSIS — M25511 Pain in right shoulder: Secondary | ICD-10-CM | POA: Diagnosis not present

## 2019-06-10 DIAGNOSIS — R079 Chest pain, unspecified: Secondary | ICD-10-CM | POA: Diagnosis not present

## 2019-06-10 DIAGNOSIS — G8929 Other chronic pain: Secondary | ICD-10-CM

## 2019-06-10 DIAGNOSIS — M7551 Bursitis of right shoulder: Secondary | ICD-10-CM | POA: Insufficient documentation

## 2019-06-10 DIAGNOSIS — M940 Chondrocostal junction syndrome [Tietze]: Secondary | ICD-10-CM

## 2019-06-10 MED ORDER — PREDNISONE 50 MG PO TABS: ORAL_TABLET | ORAL | 0 refills | Status: DC

## 2019-06-10 MED ORDER — COLCHICINE 0.6 MG PO TABS: 0.6000 mg | ORAL_TABLET | Freq: Every day | ORAL | 0 refills | Status: DC

## 2019-06-10 NOTE — Assessment & Plan Note (Signed)
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  Did have swelling of the [place] joint and significant decrease in swelling today compared to previous exam after the injection.  We discussed with patient about the possibility of gout line treatment and will give colchicine to see if this will be beneficial.  Continue topical anti-inflammatories.  Believe that the right shoulder is potential compensation.  Given exercises for this today as well.  Discussed posture and stability.  Follow-up again in 2 months.  Patient will be having a CT scan in the first week of November to further evaluate some soft tissue irregularities of the sternum noted on MRI.  Spent  25 minutes with patient face-to-face and had greater than 50% of counseling including as described above in assessment and plan.

## 2019-06-10 NOTE — Patient Instructions (Addendum)
Good to see you.  Medications will be at pharmacy Exercise 3 times a week See me again in 6-8 weeks if doing better we can hold off until after CT

## 2019-06-10 NOTE — Addendum Note (Signed)
Addended by: Douglass Rivers T on: 06/10/2019 11:34 AM   Modules accepted: Orders

## 2019-06-10 NOTE — Progress Notes (Signed)
[name] [address] [place], [place] [address] Phone: [phone] Subjective:   [name], am serving as a scribe for [name].  I'm seeing this patient by the request  of:    CC: Chest pain, shoulder pain  RCV:[id number]   04/28/2019 Patient had the MRI that did show some swelling more of the [name] joint.  Patient though on exam today and on ultrasound today did not show any significant swelling there is more pain over the second sternocostal area.  Patient is to increase activity slowly.  Patient will avoid any heavy lifting.  Patient knows if any worsening symptoms patient is going to increase activity as tolerated.  Patient is worsening symptoms to call us immediately.  Due to the soft tissue changes patient will follow-up again in 4 to 8 weeks  Update 06/10/2019 [name] is a 33 y.o. male coming in with complaint of shoulder and [place] joint pain. Patient states that his right shoulder has been hurting lately with external rotation in posterior shoulder. Has had pain for a while with increasing intensity recently. Is not lifting but is mountain biking.   Pain continues in [place] joint.  Patient had last exam and was given an injection.  States that maybe initially it felt somewhat better but then it started to worsen again.  Patient does have an MRI showing some enlargement of the soft tissue in the surrounding area.  Patient is due to have a CT scan in November.     Past Medical History:  Diagnosis Date  . Anxiety   . Arthritis    Costocgondritis  . Back pain    03/17/12 xray: minimal compression deformity L1. 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  . Chickenpox    childhood   . Family history of breast cancer   . Family history of lung cancer   . Family history of Lynch syndrome   . Heart murmur    present since childhood, normal echo 2002  . IBS (irritable bowel syndrome) 2017   bloody stool x1  . Insomnia  2017  . Psoriasis of scalp    Past Surgical History:  Procedure Laterality Date  . WISDOM TOOTH EXTRACTION     Social History   Socioeconomic History  . Marital status: Married    Spouse name: Not on file  . Number of children: Not on file  . Years of education: Not on file  . Highest education level: Not on file  Occupational History  . Not on file  Social Needs  . Financial resource strain: Not on file  . Food insecurity    Worry: Not on file    Inability: Not on file  . Transportation needs    Medical: Not on file    Non-medical: Not on file  Tobacco Use  . Smoking status: Never Smoker  . Smokeless tobacco: Never Used  Substance and Sexual Activity  . Alcohol use: Not Currently    Comment: 3x a week.   . Drug use: [name]  . Sexual activity: Yes    Birth control/protection: None  Lifestyle  . Physical activity    Days per week: Not on file    Minutes per session: Not on file  . Stress: Not on file  Relationships  . Social Herbalist on phone: Not on file    Gets together: Not on file    Attends religious service: Not on file  Active member of club or organization: Not on file    Attends meetings of clubs or organizations: Not on file    Relationship status: Not on file  Other Topics Concern  . Not on file  Social History Narrative   Originally from  [name]. Married to [name].    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 [name] children. 1 dog.    Wears his seatbelt. Wears a bike helmet.    Exercises more than 3 times a week.    Has a smoke alarm the home.    Has firearms in the home.   [name] Known Allergies Family History  Problem Relation Age of Onset  . Alcohol abuse Mother   . Other Mother        Lynch syndrome- PMS2 nutation   . Lung cancer Father 65       nonsmoker  . Breast cancer Maternal Grandmother 62  . Endometrial cancer Maternal Great-grandmother 67  . Ovarian cancer Maternal Great-grandmother 26  . Cancer Other 54        urothelial cancer  . Other Sister        Lynch syndrome   . Colon cancer Neg Hx   . Prostate cancer Neg Hx   . Pancreatic cancer Neg Hx   . Gastric cancer Neg Hx   . Stomach cancer Neg Hx   . Esophageal cancer Neg Hx     Current Outpatient Medications (Endocrine & Metabolic):  .  predniSONE (DELTASONE) 50 MG tablet, Take 1 tablet by mouth daily  Current Outpatient Medications (Cardiovascular):  .  nitroGLYCERIN (NITRODUR - DOSED IN MG/24 HR) 0.2 mg/hr patch, 1/4 patch daily   Current Outpatient Medications (Analgesics):  .  colchicine 0.6 MG tablet, Take 1 tablet (0.6 mg total) by mouth daily.   Current Outpatient Medications (Other):  Marland Kitchen  Diclofenac Sodium (PENNSAID) 2 % SOLN, Place 2 g onto the skin 2 (two) times daily. .  Vitamin D, Ergocalciferol, (DRISDOL) 1.25 MG (50000 UT) CAPS capsule, Take 1 capsule (50,000 Units total) by mouth every 7 (seven) days.    Past medical history, social, surgical and family history all reviewed in electronic medical record.  [name] pertanent information unless stated regarding to the chief complaint.   Review of Systems:  [name] headache, visual changes, nausea, vomiting, diarrhea, constipation, dizziness, abdominal pain, skin rash, fevers, chills, night sweats, weight loss, swollen lymph nodes, body aches, joint swelling, , chest pain, shortness of breath, mood changes.  Positive muscle aches  Objective  Blood pressure 110/66, pulse 63, height _0  (1.93 m), weight 195 lb (88.5 kg), SpO2 98 %.    General: [name] apparent distress alert and oriented x3 mood and affect normal, dressed appropriately.  HEENT: Pupils equal, extraocular movements intact  Respiratory: Patient's speak in full sentences and does not appear short of breath  Cardiovascular: [name] lower extremity edema, non tender, [name] erythema  Skin: Warm dry intact with [name] signs of infection or rash on extremities or on axial skeleton.  Abdomen: Soft nontender  Neuro: Cranial nerves II through  XII are intact, neurovascularly intact in all extremities with 2+ DTRs and 2+ pulses.  Lymph: [name] lymphadenopathy of posterior or anterior cervical chain or axillae bilaterally.  Gait normal with good balance and coordination.  MSK:  Non tender with full range of motion and good stability and symmetric strength and tone of  elbows, wrist, hip, knee and ankles bilaterally.  Right shoulder exam shows the patient does have  a positive impingement noted.  Otherwise full strength.  Near full range of motion.  Patient's left sternal area near the [facility] joint is improved with significant decrease in swelling.  Patient seems to allow for more palpation in the area.  MSK US performed of: Right shoulder This study was ordered, performed, and interpreted by Charlann Boxer D.O.  Shoulder:   Supraspinatus: Patient does have a subacromial bursitis noted.  [name] true tear of the rotator cuff noted.  Some mild thickening of the posterior labrum noted.  Reviewing patient's left [place] joint significant decrease in swelling noted.   Impression and Recommendations:     This case required medical decision making of moderate complexity. The above documentation has been reviewed and is accurate and complete Paul Pulley, DO       Note: This dictation was prepared with Dragon dictation along with smaller phrase technology. Any transcriptional errors that result from this process are unintentional.

## 2019-07-12 ENCOUNTER — Other Ambulatory Visit: Payer: Self-pay | Admitting: Family Medicine

## 2019-07-23 ENCOUNTER — Ambulatory Visit: Payer: BC Managed Care – PPO | Admitting: Family Medicine

## 2019-07-28 ENCOUNTER — Other Ambulatory Visit: Payer: Self-pay

## 2019-07-28 ENCOUNTER — Ambulatory Visit (INDEPENDENT_AMBULATORY_CARE_PROVIDER_SITE_OTHER)
Admission: RE | Admit: 2019-07-28 | Discharge: 2019-07-28 | Disposition: A | Discharge status: Home / Self Care | Payer: BC Managed Care – PPO | Source: Ambulatory Visit | Attending: Family Medicine | Admitting: Family Medicine

## 2019-07-28 DIAGNOSIS — R079 Chest pain, unspecified: Secondary | ICD-10-CM | POA: Diagnosis not present

## 2019-07-28 MED ORDER — IOHEXOL 300 MG/ML  SOLN
80.0000 mL | Freq: Once | INTRAMUSCULAR | Status: AC | PRN
  Administered 2019-07-28: 80 mL via INTRAVENOUS

## 2019-07-29 ENCOUNTER — Encounter: Payer: Self-pay | Admitting: Family Medicine

## 2019-09-11 ENCOUNTER — Other Ambulatory Visit: Payer: Self-pay | Admitting: Family Medicine

## 2019-10-17 ENCOUNTER — Other Ambulatory Visit: Payer: Self-pay | Admitting: Family Medicine

## 2019-10-31 ENCOUNTER — Other Ambulatory Visit: Payer: Self-pay | Admitting: Family Medicine

## 2019-11-17 DIAGNOSIS — Z23 Encounter for immunization: Secondary | ICD-10-CM | POA: Diagnosis not present

## 2019-12-16 DIAGNOSIS — Z23 Encounter for immunization: Secondary | ICD-10-CM | POA: Diagnosis not present

## 2019-12-23 ENCOUNTER — Encounter: Payer: Self-pay | Admitting: Family Medicine

## 2019-12-25 ENCOUNTER — Other Ambulatory Visit: Payer: Self-pay

## 2019-12-25 ENCOUNTER — Encounter: Payer: Self-pay | Admitting: Family Medicine

## 2019-12-25 ENCOUNTER — Ambulatory Visit: Payer: BC Managed Care – PPO | Admitting: Family Medicine

## 2019-12-25 VITALS — BP 130/75 | HR 62 | Temp 98.1°F | Resp 16 | Ht 76.0 in | Wt 186.4 lb

## 2019-12-25 DIAGNOSIS — J9859 Other diseases of mediastinum, not elsewhere classified: Secondary | ICD-10-CM

## 2019-12-25 DIAGNOSIS — M2550 Pain in unspecified joint: Secondary | ICD-10-CM | POA: Diagnosis not present

## 2019-12-25 DIAGNOSIS — R0789 Other chest pain: Secondary | ICD-10-CM

## 2019-12-25 NOTE — Progress Notes (Signed)
Paul [name] , 07/17/1986, 34 y.o., male MRN: [id number] Patient Care Team    Relationship Specialty Notifications Start End  Ma Hillock, DO PCP - General Family Medicine  05/04/15     Chief Complaint  Patient presents with  . costochondritis    Pt is wanting a second opinion on this condition. He went to Dr [name] and they tried several things with no improvement      Subjective: Paul [name] is a 34 y.o. male presents today for follow up Costochondritis/mediastinal pain Patient returns today to discuss his continued mediastinal pain.  Was last seen by this provider approximately 1 year ago when he presented with costochondritis/mediastinal pain.  Initially he did report improvement with NSAIDs and rest from his mountain biking.  However whenever continuing any other activity from that moment his pain would resurface.  His pain went on to progress to any type of movement even laying on his left side causing discomfort surrounding his mediastinum.  When he initially presented about a year ago he did state that he heard a pop in the location of his discomfort.  He has been under the care of sports medicine and they were having difficulty controlling his discomfort.  He did not feel osteopathic manipulation was helpful.  He also had a joint injection which did not seem helpful.  He has history of vaping and had concerns vaping may have caused structural damage in his lung.Marland Kitchen  His father had lung cancer.  Patient has been diagnosed with Lynch syndrome and he also has concerns over possible correlation with his syndrome.  He has had a MRI 04/14/2019 of his sternum which resulted with an increase signal seen at the manubriosternal joint with surrounding capsular hypertrophy and small amount of joint fluid.  There was a slight cortical irregularity at that location.  It was also noted to have a heterogeneous triangular-shaped soft tissue seen within the anterior mediastinum below the  sternomanubrial joint which measures 12 mm in diameter and is slightly prominent.  There is also mild soft tissue swelling seen anterior to the sternomanubrial joint.  A repeat CT chest with contrast completed mid November 2020 was reported as normal.  Over the last year patient has had a negative ANA, negative rheumatoid factor and negative HLA-B27. He has endorsed other muscle skeletal aches including lower back and shoulders.  Prior note:  Pt presents for an OV with complaints of chest discomfort of 1 month duration.  Associated symptoms include pain with flexing pectorals or preforming push ups. He states the discomfort is worse in the morning and better towards the end of the day. He does not recall a injury or increase in activity that may have started the pain. He reports stretching helps. When stretching his chest/arms yesterday he felt and heard a pop near the center of his chest. He points to the sternum as location of pain. He reports the pain is not bad today. He has been mountain biking. He has concerns his symptoms are from vaping. He states he started vaping a few months ago, he has since stopped. His father had lung cancer.   Pt has tried nothing to ease their symptoms.   Depression screen [facility] 2/9 05/21/2018 05/08/2016  Decreased Interest 0 0  Down, Depressed, Hopeless 0 0  PHQ - 2 Score 0 0    No Known Allergies Social History   Tobacco Use  . Smoking status: Never Smoker  . Smokeless tobacco: Never Used  Substance Use  Topics  . Alcohol use: Not Currently    Comment: 3x a week.    Past Medical History:  Diagnosis Date  . Anxiety   . Arthritis    Costocgondritis  . Back pain    03/17/12 xray: minimal compression deformity L1. 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  . Chickenpox    childhood   . Family history of breast cancer   . Family history of lung cancer   . Family history of Lynch syndrome   . Heart murmur    present since childhood,  normal echo 2002  . IBS (irritable bowel syndrome) 2017   bloody stool x1  . Insomnia 2017  . Psoriasis of scalp    Past Surgical History:  Procedure Laterality Date  . WISDOM TOOTH EXTRACTION     Family History  Problem Relation Age of Onset  . Alcohol abuse Mother   . Other Mother        Lynch syndrome- PMS2 nutation   . Lung cancer Father 91       nonsmoker  . Breast cancer Maternal Grandmother 27  . Endometrial cancer Maternal Great-grandmother 51  . Ovarian cancer Maternal Great-grandmother 26  . Cancer Other 54       urothelial cancer  . Other Sister        Lynch syndrome   . Colon cancer Neg Hx   . Prostate cancer Neg Hx   . Pancreatic cancer Neg Hx   . Gastric cancer Neg Hx   . Stomach cancer Neg Hx   . Esophageal cancer Neg Hx    Allergies as of 12/25/2019   No Known Allergies     Medication List       Accurate as of December 25, 2019 10:18 AM. If you have any questions, ask your nurse or doctor.        colchicine 0.6 MG tablet Take 1 tablet (0.6 mg total) by mouth daily.   Diclofenac Sodium 2 % Soln Commonly known as: Pennsaid Place 2 g onto the skin 2 (two) times daily.   nitroGLYCERIN 0.2 mg/hr patch Commonly known as: NITRODUR - Dosed in mg/24 hr 1/4 patch daily   predniSONE 50 MG tablet Commonly known as: DELTASONE Take 1 tablet by mouth daily   Vitamin D (Ergocalciferol) 1.25 MG (50000 UNIT) Caps capsule Commonly known as: DRISDOL TAKE 1 CAPSULE (50,000 UNITS TOTAL) BY MOUTH EVERY 7 (SEVEN) DAYS.       All past medical history, surgical history, allergies, family history, immunizations andmedications were updated in the EMR today and reviewed under the history and medication portions of their EMR.     ROS: Negative, with the exception of above mentioned in HPI   Objective:  BP 130/75 (BP Location: Right Arm, Patient Position: Sitting, Cuff Size: Normal)   Pulse 62   Temp 98.1 F (36.7 C) (Temporal)   Resp 16   Ht '6\' 4"'  (1.93 m)    Wt 186 lb 6 oz (84.5 kg)   SpO2 99%   BMI 22.69 kg/m  Body mass index is 22.69 kg/m.  Gen: Afebrile. No acute distress.  HENT: AT. [place].  MMM.  Eyes:Pupils Equal Round Reactive to light, Extraocular movements intact,  Conjunctiva without redness, discharge or icterus. Neck/lymp/endocrine: Supple,nolymphadenopathy, no thyromegaly CV: RRR Chest: CTAB, no wheeze or crackles MSK: Chest wall without erythema or noticeable soft tissue swelling.  Reproducible tenderness bilateral sides of mid-sternum. Skin: no rashes, purpura or petechiae.  Neuro:  Normal gait. PERLA. EOMi.  Alert. Oriented.  Psych: Normal affect, dress and demeanor. Normal speech. Normal thought content and judgment.  No exam data present No results found. No results found for this or any previous visit (from the past 24 hour(s)).  Assessment/Plan: Paul [name] is a 34 y.o. male present for OV for  Arthralgia, unspecified joint/disorder of mediastinum/sternal pain -Midsternal pain now greater than 1 year has failed therapy with NSAIDs, OMT and joint injection with abnormal MRI last August.  However CT follow-up 3 months later did not show evidence of abnormality visualized on MRI. -ANA, rheumatoid factor and HLA-B27 have been negative.  We will add anti-CCP today to be complete. - Cyclic citrul peptide antibody, IgG ([facility]) -We will await results of lab today while considering next step. -Consider referral to rheumatology given multiple joint complaints.  However so far work-up has been negative. -Consider pulmonary , cts versus repeat imaging of mediastinum depending upon labs above.     Reviewed expectations re: course of current medical issues.  Discussed self-management of symptoms.  Outlined signs and symptoms indicating need for more acute intervention.  Patient verbalized understanding and all questions were answered.  Patient received an After-Visit Summary.    No orders of the defined types were  placed in this encounter.    Note is dictated utilizing voice recognition software. Although note has been proof read prior to signing, occasional typographical errors still can be missed. If any questions arise, please do not hesitate to call for verification.   electronically signed by:  Howard Pouch, DO  [place]

## 2019-12-25 NOTE — Patient Instructions (Addendum)
We will check your anti-CCP. If abnormal will refer you to rheumatology. If negative I believe we need to pursue other possible scenarios and have you evaluated by pulmonology.

## 2019-12-26 ENCOUNTER — Encounter: Payer: Self-pay | Admitting: Family Medicine

## 2019-12-26 DIAGNOSIS — R0789 Other chest pain: Secondary | ICD-10-CM | POA: Insufficient documentation

## 2019-12-26 DIAGNOSIS — J9859 Other diseases of mediastinum, not elsewhere classified: Secondary | ICD-10-CM | POA: Insufficient documentation

## 2019-12-26 LAB — CYCLIC CITRUL PEPTIDE ANTIBODY, IGG: Cyclic Citrullin Peptide Ab: <16 UNITS

## 2019-12-29 ENCOUNTER — Telehealth: Payer: Self-pay | Admitting: Family Medicine

## 2019-12-29 DIAGNOSIS — J9859 Other diseases of mediastinum, not elsewhere classified: Secondary | ICD-10-CM

## 2019-12-29 DIAGNOSIS — M129 Arthropathy, unspecified: Secondary | ICD-10-CM

## 2019-12-29 DIAGNOSIS — R0789 Other chest pain: Secondary | ICD-10-CM

## 2019-12-29 NOTE — Telephone Encounter (Signed)
Pt was called and VM was left to return call  °

## 2019-12-29 NOTE — Telephone Encounter (Signed)
Please inform patient the following information: His anti-CCP is normal. Since this has been going on for greater than a year now without much resolution decided to go ahead and refer him to both rheumatology (since the MRI did show arthropathy in the manubriosternal joints) and pulmonology (since original MRI did show some mediastinal changes and he has a history of vaping) for their opinions.  He should be hearing from those offices to schedule to further evaluate his condition.

## 2019-12-30 ENCOUNTER — Encounter: Payer: Self-pay | Admitting: Family Medicine

## 2020-01-20 ENCOUNTER — Other Ambulatory Visit: Payer: Self-pay

## 2020-01-20 ENCOUNTER — Ambulatory Visit: Payer: BC Managed Care – PPO | Admitting: Pulmonary Disease

## 2020-01-20 ENCOUNTER — Encounter: Payer: Self-pay | Admitting: Pulmonary Disease

## 2020-01-20 VITALS — BP 122/76 | HR 78 | Temp 97.8°F | Ht 76.0 in | Wt 191.8 lb

## 2020-01-20 DIAGNOSIS — R0789 Other chest pain: Secondary | ICD-10-CM

## 2020-01-20 DIAGNOSIS — R9389 Abnormal findings on diagnostic imaging of other specified body structures: Secondary | ICD-10-CM | POA: Diagnosis not present

## 2020-01-20 DIAGNOSIS — J9859 Other diseases of mediastinum, not elsewhere classified: Secondary | ICD-10-CM | POA: Diagnosis not present

## 2020-01-20 NOTE — Progress Notes (Signed)
Paul [name]    [id number]    07/23/1986  Primary Care Physician:Kuneff, Reinaldo Raddle, DO  Referring Physician: Ma Hillock, DO [address] [place],  [place] [address]  Chief complaint:   Chest pain   Paul [name] is a 34 year male with history of costochondritis who presents for evaluation of chest pain. Patient has been followed by his PCP and referred to sports medicine for this problem. He first noticed this pain after vapeing for 6 months. He quit vaping but continued to have pain. He denies any trauma to this area. We also discussed physical activities such as golf in with him which is the.  Continues to ride a mountain bike. He followed with sports medicine for a year and frustrated with ongoing pain.     Pets: 2 dogs Occupation: Chief Financial Officer , works in an office Exposures: no exposure history Smoking history: Vaped for 6 months  Travel history: no significant travel history Relevant family history: no significant lung lung conditions in his family    Outpatient Encounter Medications as of 01/20/2020  Medication Sig  . Vitamin D, Ergocalciferol, (DRISDOL) 1.25 MG (50000 UNIT) CAPS capsule TAKE 1 CAPSULE (50,000 UNITS TOTAL) BY MOUTH EVERY 7 (SEVEN) DAYS.  . [DISCONTINUED] colchicine 0.6 MG tablet Take 1 tablet (0.6 mg total) by mouth daily. (Patient not taking: Reported on 12/25/2019)  . [DISCONTINUED] Diclofenac Sodium (PENNSAID) 2 % SOLN Place 2 g onto the skin 2 (two) times daily. (Patient not taking: Reported on 12/25/2019)  . [DISCONTINUED] nitroGLYCERIN (NITRODUR - DOSED IN MG/24 HR) 0.2 mg/hr patch 1/4 patch daily (Patient not taking: Reported on 12/25/2019)  . [DISCONTINUED] predniSONE (DELTASONE) 50 MG tablet Take 1 tablet by mouth daily (Patient not taking: Reported on 12/25/2019)   No facility-administered encounter medications on file as of 01/20/2020.    Allergies as of 01/20/2020  . (No Known Allergies)    Past Medical History:  Diagnosis Date  .  Anxiety   . Arthritis    Costocgondritis  . Back pain    03/17/12 xray: minimal compression deformity L1. 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  . Chickenpox    childhood   . Family history of breast cancer   . Family history of lung cancer   . Family history of Lynch syndrome   . Heart murmur    present since childhood, normal echo 2002  . IBS (irritable bowel syndrome) 2017   bloody stool x1  . Insomnia 2017  . Psoriasis of scalp     Past Surgical History:  Procedure Laterality Date  . WISDOM TOOTH EXTRACTION      Family History  Problem Relation Age of Onset  . Alcohol abuse Mother   . Other Mother        Lynch syndrome- PMS2 nutation   . Lung cancer Father 64       nonsmoker  . Breast cancer Maternal Grandmother 64  . Endometrial cancer Maternal Great-grandmother 61  . Ovarian cancer Maternal Great-grandmother 45  . Cancer Other 54       urothelial cancer  . Other Sister        Lynch syndrome   . Colon cancer Neg Hx   . Prostate cancer Neg Hx   . Pancreatic cancer Neg Hx   . Gastric cancer Neg Hx   . Stomach cancer Neg Hx   . Esophageal cancer Neg Hx     Social History   Socioeconomic History  .  Marital status: Married    Spouse name: Not on file  . Number of children: Not on file  . Years of education: Not on file  . Highest education level: Not on file  Occupational History  . Not on file  Tobacco Use  . Smoking status: Current Every Day Smoker    Types: E-cigarettes  . Smokeless tobacco: Never Used  . Tobacco comment: vaped 6-8 months  Substance and Sexual Activity  . Alcohol use: Not Currently    Comment: 3x a week.   . Drug use: No  . Sexual activity: Yes    Birth control/protection: None  Other Topics Concern  . Not on file  Social History Narrative   Originally from  [place]. Married to [place].    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    Wears his seatbelt. Wears a bike helmet.     Exercises more than 3 times a week.    Has a smoke alarm the home.    Has firearms in the home.   Social Determinants of Health   Financial Resource Strain:   . Difficulty of Paying Living Expenses:   Food Insecurity:   . Worried About Charity fundraiser in the Last Year:   . Arboriculturist in the Last Year:   Transportation Needs:   . Film/video editor (Medical):   Marland Kitchen Lack of Transportation (Non-Medical):   Physical Activity:   . Days of Exercise per Week:   . Minutes of Exercise per Session:   Stress:   . Feeling of Stress :   Social Connections:   . Frequency of Communication with Friends and Family:   . Frequency of Social Gatherings with Friends and Family:   . Attends Religious Services:   . Active Member of Clubs or Organizations:   . Attends Archivist Meetings:   Marland Kitchen Marital Status:   Intimate Partner Violence:   . Fear of Current or Ex-Partner:   . Emotionally Abused:   Marland Kitchen Physically Abused:   . Sexually Abused:     Review of systems: Review of Systems  Constitutional: Negative for fever and chills.  HENT: Negative.   Eyes: Negative for blurred vision.  Respiratory: as per HPI  Cardiovascular: Negative for chest pain and palpitations.  Gastrointestinal: Negative for vomiting, diarrhea, blood per rectum. Genitourinary: Negative for dysuria, urgency, frequency and hematuria.  Musculoskeletal: Negative for myalgias. Positive for joint pain.  Skin: Negative for itching and rash.  Neurological: Negative for dizziness, tremors, focal weakness, seizures and loss of consciousness.  Endo/Heme/Allergies: Negative for environmental allergies.  Psychiatric/Behavioral: Negative for depression, suicidal ideas and hallucinations.  All other systems reviewed and are negative.  Physical Exam: Blood pressure 122/76, pulse 78, temperature 97.8 F (36.6 C), temperature source Temporal, height '6\' 4"'  (1.93 m), weight 191 lb 12.8 oz (87 kg).  General: NAD, nl  appearance HE: Normocephalic, atraumatic , EOMI, Conjunctivae normal ENT: No congestion, no rhinorrhea, no exudate or erythema  Cardiovascular: Normal rate, regular rhythm.  No murmurs, rubs, or gallops Pulmonary : Effort normal, breath sounds normal. No wheezes, rales, or rhonchi Abdominal: soft, nontender,  bowel sounds present Musculoskeletal: no swelling , deformity, chest pain not produced with palpation. P Skin: Warm, dry , no bruising, erythema, or rash Psychiatric/Behavioral:  normal mood, normal behavior     Data Reviewed: Imaging:  MR Sternum 04/14/2019 : IMPRESSION: 1. 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 This could be due to inflammatory arthropathy given the patient's age. 2. Slightly prominent soft tissue within the anterior mediastinum which could be reactive or remnant thymus. Would recommend follow-up CT chest with contrast in 3 months to determine stability or Resolution.  CT Chest 07/28/2019: normal  CT chest   PFTs:  Labs:  ANA , RF 12/16/2018 - negative Uric acid 12/16/2018 - normal   Assessment:  Paul [name] is a 34 year old male with costochondritis which has not been relieved with nsaids or joint injections. PCP has worked up for autoimmune causes and this workup was unrevealing for cause. 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 We will order a CT Chest with contrast to compare to previous CT / look for any changes.   It is unfortunate but I discussed with patient he may need to continue conservative therapy for a longer period of time.   If CT Chest is abnormal we will schedule follow up , if not patient can continue to follow with PCP and sports medicine for ongoing therapy.    Plan/Recommendations: - CT Chest with contrast - Follow up if CT Chest is abnormal  Tamsen Snider, MD PGY1   CC: Ma Hillock, DO  Attending note: I have  seen and examined the patient. History, labs and imaging reviewed. Agree with assessment and plan  34 year old with atypical chest pain, costochondritis.  He has been extensively worked up by Dr. Raoul Pitch and [name]. MRI last year showed prominent soft tissue but follow-up CT scan with no abnormalities Given persistent symptoms we will repeat CT chest.  Marshell Garfinkel MD [facility] 01/21/2020, 8:41 AM

## 2020-01-20 NOTE — Patient Instructions (Addendum)
Mr.Hedglin, thank you for coming to see [place] today. We have reviewed your prior imaging and after our discussion this seems consistent with costochondritis. We will order a CT Chest with contrast to evaluate for any changes since CT Chest in November. If this is normal we recommend following up with sports medicine and your primary care doctor to continue treatment for costochondritis.    - CT Chest with contrast - No need to schedule for follow up visit unless finding of CT Chest are abnormal

## 2020-02-04 ENCOUNTER — Ambulatory Visit
Admission: RE | Admit: 2020-02-04 | Discharge: 2020-02-04 | Disposition: A | Discharge status: Home / Self Care | Payer: BC Managed Care – PPO | Source: Ambulatory Visit | Attending: Pulmonary Disease | Admitting: Pulmonary Disease

## 2020-02-04 ENCOUNTER — Other Ambulatory Visit: Payer: Self-pay

## 2020-02-04 DIAGNOSIS — E320 Persistent hyperplasia of thymus: Secondary | ICD-10-CM | POA: Diagnosis not present

## 2020-02-04 DIAGNOSIS — R9389 Abnormal findings on diagnostic imaging of other specified body structures: Secondary | ICD-10-CM

## 2020-03-23 DIAGNOSIS — Z20822 Contact with and (suspected) exposure to covid-19: Secondary | ICD-10-CM | POA: Diagnosis not present

## 2020-03-23 DIAGNOSIS — Z03818 Encounter for observation for suspected exposure to other biological agents ruled out: Secondary | ICD-10-CM | POA: Diagnosis not present

## 2020-03-26 NOTE — Progress Notes (Signed)
Office Visit Note  Patient: Paul [name]             Date of Birth: November 17, 1985           MRN: [id number]             PCP: Ma Hillock, DO Referring: Ma Hillock, DO Visit Date: 04/07/2020 Occupation: Engineer  Subjective:  Pain in joints.   History of Present Illness: Paul [name] is a 34 y.o. male seen in consultation per request of his PCP.  According to the patient his symptoms are started about 2 years ago with the chest pain.  He states he was having popping sensation in his chest.  He was referred by his PCP to Dr. Pricilla Larsson who diagnosed him with costochondritis.  He was referred to physical therapy.  He recalls having cortisone injection in the costochondral joints as well.  He was treated with anti-inflammatories and reflux medications without much help per patient.  He had x-ray of his sternum followed by MRI which showed some edema in the sternal mandibular joint.  He also had a CT scan as he had remnant thymus.  The CT scan was unremarkable.  His symptoms persist and he was referred to Dr. Kimber Relic who did a thorough evaluation including CT scan of his chest which was unremarkable.  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  He states that he continues to have some discomfort in his sternum.  He states the pain is is constant.  He rates it on a scale of 0-10 about 3.  He also had some problems with right shoulder joint bursitis which comes and goes.  He has ongoing discomfort in his knee joints.  He had left knee joint injury in the past while playing basketball.  He recalls having MRI of his knee joint in the past which showed osteoarthritis.  He also had a right ankle joint injury while playing basketball in the past.  He has off-and-on pain in his right ankle joint if he does not distracted.  He denies any history of psoriasis.  He states he has had rashes on his scalp in the past but he never saw a dermatologist.  He does not have a rash now.  There is  no history of diarrhea or constipation.  There is no family history of psoriasis or IBD.  There is no family history of rheumatoid arthritis or any other autoimmune disease.  He denies any history of Achilles tendinitis or plantar fasciitis.  Activities of Daily Living:  Patient reports morning stiffness for a few  minutes.   Patient Denies nocturnal pain.  Difficulty dressing/grooming: Denies Difficulty climbing stairs: Denies Difficulty getting out of chair: Denies Difficulty using hands for taps, buttons, cutlery, and/or writing: Denies  Review of Systems  Constitutional: Negative for fatigue.  HENT: Negative for mouth sores, mouth dryness and nose dryness.   Eyes: Negative for itching and dryness.  Respiratory: Negative for shortness of breath and difficulty breathing.   Cardiovascular: Negative for chest pain and palpitations.  Gastrointestinal: Negative for blood in stool, constipation and diarrhea.  Endocrine: Negative for increased urination.  Genitourinary: Negative for difficulty urinating.  Musculoskeletal: Positive for arthralgias, joint pain and morning stiffness. Negative for joint swelling, myalgias, muscle tenderness and myalgias.  Skin: Negative for color change, rash and redness.  Allergic/Immunologic: Negative for susceptible to infections.  Neurological: Negative for dizziness, numbness, headaches, memory loss and weakness.  Hematological: Negative for bruising/bleeding  tendency.  Psychiatric/Behavioral: Negative for confusion.    PMFS History:  Patient Active Problem List   Diagnosis Date Noted  . Sternal pain 12/26/2019  . Disorder of mediastinum 12/26/2019  . Acute bursitis of right shoulder 06/10/2019  . Nonallopathic lesion of cervical region 01/27/2019  . Nonallopathic lesion of thoracic region 01/27/2019  . Other fatigue 12/12/2018  . PMS2-related Lynch syndrome (HNPCC4) 12/02/2018  . Right ankle pain 11/05/2018  . Family history of Lynch syndrome   .  Family history of breast cancer   . Family history of lung cancer   . Somatic dysfunction of rib 06/19/2018  . Costochondritis 05/21/2018  . Psoriasis of scalp 05/04/2015    Past Medical History:  Diagnosis Date  . Anxiety   . Arthritis    Costocgondritis  . Back pain    03/17/12 xray: minimal compression deformity L1. 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  . Chickenpox    childhood   . Family history of breast cancer   . Family history of lung cancer   . Family history of Lynch syndrome   . Heart murmur    present since childhood, normal echo 2002  . IBS (irritable bowel syndrome) 2017   bloody stool x1  . Insomnia 2017  . Psoriasis of scalp     Family History  Problem Relation Age of Onset  . Alcohol abuse Mother   . Other Mother        Lynch syndrome- PMS2 nutation   . Lung cancer Father 36       nonsmoker  . Breast cancer Maternal Grandmother 71  . Endometrial cancer Maternal Great-grandmother 50  . Ovarian cancer Maternal Great-grandmother 57  . Cancer Other 54       urothelial cancer  . Other Sister        Lynch syndrome   . Healthy Brother   . Colon cancer Neg Hx   . Prostate cancer Neg Hx   . Pancreatic cancer Neg Hx   . Gastric cancer Neg Hx   . Stomach cancer Neg Hx   . Esophageal cancer Neg Hx    Past Surgical History:  Procedure Laterality Date  . WISDOM TOOTH EXTRACTION     Social History   Social History Narrative   Originally from  [place]. Married to [place].    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    Wears his seatbelt. Wears a bike helmet.    Exercises more than 3 times a week.    Has a smoke alarm the home.    Has firearms in the home.   Immunization History  Administered Date(s) Administered  . DTaP 01/14/1987, 03/16/1987, 05/18/1987, 06/02/1988, 02/13/1992  . Hepatitis B 07/07/1992, 04/28/1997, 09/15/1997  . HiB (PRP-OMP) 06/02/1988  . IPV 01/14/1987, 03/16/1987, 06/02/1988,  02/13/1992  . Influenza,inj,Quad PF,6+ Mos 05/08/2016, 05/21/2018  . MMR 02/11/1987, 04/29/1999  . Meningococcal Conjugate 04/10/2005  . Moderna SARS-COVID-2 Vaccination 11/17/2019, 12/16/2019  . Td 04/12/2015     Objective: Vital Signs: BP 118/75 (BP Location: Right Arm, Patient Position: Sitting, Cuff Size: Normal)   Pulse 72   Resp 16   Ht _0  (1.93 m)   Wt 202 lb 6.4 oz (91.8 kg)   BMI 24.64 kg/m    Physical Exam Vitals and nursing note reviewed.  Constitutional:      Appearance: He is well-developed.  HENT:     Head: Normocephalic and atraumatic.  Eyes:  Conjunctiva/sclera: Conjunctivae normal.     Pupils: Pupils are equal, round, and reactive to light.  Cardiovascular:     Rate and Rhythm: Normal rate and regular rhythm.     Heart sounds: Normal heart sounds.  Pulmonary:     Effort: Pulmonary effort is normal.     Breath sounds: Normal breath sounds.  Abdominal:     General: Bowel sounds are normal.     Palpations: Abdomen is soft.  Musculoskeletal:     Cervical back: Normal range of motion and neck supple.  Skin:    General: Skin is warm and dry.     Capillary Refill: Capillary refill takes less than 2 seconds.  Neurological:     Mental Status: He is alert and oriented to person, place, and time.  Psychiatric:        Behavior: Behavior normal.      Musculoskeletal Exam: C-spine was in good range of motion.  Thoracic and lumbar spine were in good range of motion.  He had tenderness over left third and fourth costochondral junction.  There was no tenderness over manubrium.  He had no tenderness on palpation over cervical, thoracic or lumbar spine.  He had no SI joint tenderness.  He has some discomfort range of motion of his right shoulder joint.  Elbow joints, wrist joints, MCPs PIPs and DIPs with good range of motion with no synovitis.  No nail pitting was noted.  Hip joints, knee joints, ankles, MTPs and PIPs and DIPs with good range of motion with no  synovitis.  He had no Achilles tendinitis or plantar fasciitis.  CDAI Exam: CDAI Score: -- Patient Global: --; Provider Global: -- Swollen: --; Tender: -- Joint Exam 04/07/2020   No joint exam has been documented for this visit   There is currently no information documented on the homunculus. Go to the Rheumatology activity and complete the homunculus joint exam.  Investigation: No additional findings.  Imaging: No results found.  Recent Labs: Lab Results  Component Value Date   WBC 4.9 12/16/2018   HGB 14.3 12/16/2018   PLT 292.0 12/16/2018   NA 136 12/16/2018   K 4.4 12/16/2018   CL 101 12/16/2018   CO2 25 12/16/2018   GLUCOSE 103 (H) 12/16/2018   BUN 15 12/16/2018   CREATININE 0.97 12/16/2018   BILITOT 0.8 12/16/2018   ALKPHOS 41 12/16/2018   AST 13 12/16/2018   ALT 14 12/16/2018   PROT 6.9 12/16/2018   ALBUMIN 4.8 12/16/2018   CALCIUM 9.7 12/16/2018   CALCIUM 9.5 12/16/2018   12/25/19: CCP<16, ESR 1, uric acid 5.6, Vit D 28.28, RF<14, ANA-, HLAB27-, Ca 9.7, PTH 41, testosterone 373.13, lipase 20  Speciality Comments: No specialty comments available.  Procedures:  No procedures performed Allergies: Patient has no known allergies.   Assessment / Plan:     Visit Diagnoses: Costochondritis -patient has been suffering from costochondritis for the last 2 years.  He has had costochondral injections by Dr. [name].  He also has tried physical therapy and anti-inflammatories without much results.  I reviewed MRI of the sternum from August 2020 which showed sternal mandibular joint arthropathy with surrounding soft tissue edema and joint fluid.  There was suspicion of inflammatory arthropathy.  No costochondral inflammation was noted.  He had CT scan of his chest in November 2020 which was unremarkable.  He was also evaluated by Dr. Vaughan Browner and had another CT of his chest and May 2021 which showed mild thymic hyperplasia otherwise unremarkable.  He denies any shortness of  breath.  I detailed discussion regarding costochondritis with him.  I also reviewed most recent labs done by his PCP.  His sed rate is normal.  HLA-B27 is negative.  He has no clinical features of psoriasis.  No nail pitting or psoriasis was noted on the examination.  Patient denies any history of psoriasis in the past.  There is no family history of psoriasis.  We discussed anti-inflammatories but he declined as he has used them in the past without much results.  Natural anti-inflammatories were discussed.  Use of topical Voltaren gel was discussed.  Side effects were discussed.  I will also refer him to physical therapy.  I would like to see response to physical therapy.  Plan: Ambulatory referral to Physical Therapy.    Acute bursitis of right shoulder -he states he has been having discomfort in his right shoulder for the last couple of months off and on.  He declined x-ray.  We will see response to the physical therapy.  Plan: Ambulatory referral to Physical Therapy  Chronic pain of both knees-he states he has had knee injuries from playing basketball in the past.  He has had x-rays in the past which were consistent with arthritis per patient.  I do not have those x-rays to review.  Chronic right ankle pain-patient relates to an old basketball injury and it only bothers him if he does not do stretches ankle.  He denies any history of swelling.  There is no history of plantar fasciitis or Achilles tendinitis.  He had no tenderness on examination today.  There is history of psoriasis mentioned in the chart.  Patient denies any history of psoriasis.  He states he has never seen a dermatologist.  No psoriasis was noted on my examination today.  PMS2-related Lynch syndrome (HNPCC4)  Family history of Lynch syndrome  Family history of lung cancer  Family history of breast cancer  Orders: Orders Placed This Encounter  Procedures  . Ambulatory referral to Physical Therapy   No orders of the  defined types were placed in this encounter.    Follow-Up Instructions: Return in about 2 months (around 06/08/2020) for Costochondritis.   Bo Merino, MD  Note - This record has been created using Editor, commissioning.  Chart creation errors have been sought, but may not always  have been located. Such creation errors do not reflect on  the standard of medical care.

## 2020-04-07 ENCOUNTER — Encounter: Payer: Self-pay | Admitting: Rheumatology

## 2020-04-07 ENCOUNTER — Ambulatory Visit: Payer: BC Managed Care – PPO | Admitting: Rheumatology

## 2020-04-07 ENCOUNTER — Other Ambulatory Visit: Payer: Self-pay

## 2020-04-07 VITALS — BP 118/75 | HR 72 | Resp 16 | Ht 76.0 in | Wt 202.4 lb

## 2020-04-07 DIAGNOSIS — Z1509 Genetic susceptibility to other malignant neoplasm: Secondary | ICD-10-CM | POA: Diagnosis not present

## 2020-04-07 DIAGNOSIS — M25562 Pain in left knee: Secondary | ICD-10-CM

## 2020-04-07 DIAGNOSIS — Z800 Family history of malignant neoplasm of digestive organs: Secondary | ICD-10-CM

## 2020-04-07 DIAGNOSIS — M940 Chondrocostal junction syndrome [Tietze]: Secondary | ICD-10-CM

## 2020-04-07 DIAGNOSIS — G8929 Other chronic pain: Secondary | ICD-10-CM

## 2020-04-07 DIAGNOSIS — Z801 Family history of malignant neoplasm of trachea, bronchus and lung: Secondary | ICD-10-CM

## 2020-04-07 DIAGNOSIS — M25561 Pain in right knee: Secondary | ICD-10-CM | POA: Diagnosis not present

## 2020-04-07 DIAGNOSIS — M7551 Bursitis of right shoulder: Secondary | ICD-10-CM

## 2020-04-07 DIAGNOSIS — Z803 Family history of malignant neoplasm of breast: Secondary | ICD-10-CM

## 2020-04-07 DIAGNOSIS — M25571 Pain in right ankle and joints of right foot: Secondary | ICD-10-CM

## 2020-04-17 NOTE — Progress Notes (Deleted)
Office Visit Note  Patient: Paul [name]             Date of Birth: 16-May-1986           MRN: [id number]             PCP: Ma Hillock, DO Referring: Ma Hillock, DO Visit Date: 04/29/2020 Occupation: '@GUAROCC' @  Subjective:  No chief complaint on file.   History of Present Illness: Paul [name] is a 34 y.o. male ***   Activities of Daily Living:  Patient reports morning stiffness for *** {minute/hour:19697}.   Patient {ACTIONS;DENIES/REPORTS:21021675::"Denies"} nocturnal pain.  Difficulty dressing/grooming: {ACTIONS;DENIES/REPORTS:21021675::"Denies"} Difficulty climbing stairs: {ACTIONS;DENIES/REPORTS:21021675::"Denies"} Difficulty getting out of chair: {ACTIONS;DENIES/REPORTS:21021675::"Denies"} Difficulty using hands for taps, buttons, cutlery, and/or writing: {ACTIONS;DENIES/REPORTS:21021675::"Denies"}  No Rheumatology ROS completed.   PMFS History:  Patient Active Problem List   Diagnosis Date Noted  . Sternal pain 12/26/2019  . Disorder of mediastinum 12/26/2019  . Acute bursitis of right shoulder 06/10/2019  . Nonallopathic lesion of cervical region 01/27/2019  . Nonallopathic lesion of thoracic region 01/27/2019  . Other fatigue 12/12/2018  . PMS2-related Lynch syndrome (HNPCC4) 12/02/2018  . Right ankle pain 11/05/2018  . Family history of Lynch syndrome   . Family history of breast cancer   . Family history of lung cancer   . Somatic dysfunction of rib 06/19/2018  . Costochondritis 05/21/2018  . Psoriasis of scalp 05/04/2015    Past Medical History:  Diagnosis Date  . Anxiety   . Arthritis    Costocgondritis  . Back pain    03/17/12 xray: minimal compression deformity L1. 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  . Chickenpox    childhood   . Family history of breast cancer   . Family history of lung cancer   . Family history of Lynch syndrome   . Heart murmur    present since childhood, normal echo 2002  . IBS  (irritable bowel syndrome) 2017   bloody stool x1  . Insomnia 2017  . Psoriasis of scalp     Family History  Problem Relation Age of Onset  . Alcohol abuse Mother   . Other Mother        Lynch syndrome- PMS2 nutation   . Lung cancer Father 66       nonsmoker  . Breast cancer Maternal Grandmother 79  . Endometrial cancer Maternal Great-grandmother 18  . Ovarian cancer Maternal Great-grandmother 44  . Cancer Other 54       urothelial cancer  . Other Sister        Lynch syndrome   . Healthy Brother   . Colon cancer Neg Hx   . Prostate cancer Neg Hx   . Pancreatic cancer Neg Hx   . Gastric cancer Neg Hx   . Stomach cancer Neg Hx   . Esophageal cancer Neg Hx    Past Surgical History:  Procedure Laterality Date  . WISDOM TOOTH EXTRACTION     Social History   Social History Narrative   Originally from  [place]. Married to [place].    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    Wears his seatbelt. Wears a bike helmet.    Exercises more than 3 times a week.    Has a smoke alarm the home.    Has firearms in the home.   Immunization History  Administered Date(s) Administered  . DTaP 01/14/1987, 03/16/1987, 05/18/1987, 06/02/1988, 02/13/1992  . Hepatitis B 07/07/1992,  04/28/1997, 09/15/1997  . HiB (PRP-OMP) 06/02/1988  . IPV 01/14/1987, 03/16/1987, 06/02/1988, 02/13/1992  . Influenza,inj,Quad PF,6+ Mos 05/08/2016, 05/21/2018  . MMR 02/11/1987, 04/29/1999  . Meningococcal Conjugate 04/10/2005  . Moderna SARS-COVID-2 Vaccination 11/17/2019, 12/16/2019  . Td 04/12/2015     Objective: Vital Signs: There were no vitals taken for this visit.   Physical Exam   Musculoskeletal Exam: ***  CDAI Exam: CDAI Score: -- Patient Global: --; Provider Global: -- Swollen: --; Tender: -- Joint Exam 04/29/2020   No joint exam has been documented for this visit   There is currently no information documented on the homunculus. Go to the Rheumatology  activity and complete the homunculus joint exam.  Investigation: No additional findings.  Imaging: No results found.  Recent Labs: Lab Results  Component Value Date   WBC 4.9 12/16/2018   HGB 14.3 12/16/2018   PLT 292.0 12/16/2018   NA 136 12/16/2018   K 4.4 12/16/2018   CL 101 12/16/2018   CO2 25 12/16/2018   GLUCOSE 103 (H) 12/16/2018   BUN 15 12/16/2018   CREATININE 0.97 12/16/2018   BILITOT 0.8 12/16/2018   ALKPHOS 41 12/16/2018   AST 13 12/16/2018   ALT 14 12/16/2018   PROT 6.9 12/16/2018   ALBUMIN 4.8 12/16/2018   CALCIUM 9.7 12/16/2018   CALCIUM 9.5 12/16/2018    Speciality Comments: No specialty comments available.  Procedures:  No procedures performed Allergies: Patient has no known allergies.   Assessment / Plan:     Visit Diagnoses: No diagnosis found.  Orders: No orders of the defined types were placed in this encounter.  No orders of the defined types were placed in this encounter.   Face-to-face time spent with patient was *** minutes. Greater than 50% of time was spent in counseling and coordination of care.  Follow-Up Instructions: No follow-ups on file.   Bo Merino, MD  Note - This record has been created using Editor, commissioning.  Chart creation errors have been sought, but may not always  have been located. Such creation errors do not reflect on  the standard of medical care.

## 2020-04-22 ENCOUNTER — Other Ambulatory Visit: Payer: Self-pay | Admitting: Family Medicine

## 2020-04-29 ENCOUNTER — Ambulatory Visit: Payer: BC Managed Care – PPO | Admitting: Rheumatology

## 2020-05-06 DIAGNOSIS — M940 Chondrocostal junction syndrome [Tietze]: Secondary | ICD-10-CM | POA: Diagnosis not present

## 2020-05-06 DIAGNOSIS — M25511 Pain in right shoulder: Secondary | ICD-10-CM | POA: Diagnosis not present

## 2020-05-12 DIAGNOSIS — M25511 Pain in right shoulder: Secondary | ICD-10-CM | POA: Diagnosis not present

## 2020-05-12 DIAGNOSIS — M940 Chondrocostal junction syndrome [Tietze]: Secondary | ICD-10-CM | POA: Diagnosis not present

## 2020-05-19 DIAGNOSIS — M25511 Pain in right shoulder: Secondary | ICD-10-CM | POA: Diagnosis not present

## 2020-05-19 DIAGNOSIS — M9902 Segmental and somatic dysfunction of thoracic region: Secondary | ICD-10-CM | POA: Diagnosis not present

## 2020-05-19 DIAGNOSIS — M542 Cervicalgia: Secondary | ICD-10-CM | POA: Diagnosis not present

## 2020-05-19 DIAGNOSIS — M9901 Segmental and somatic dysfunction of cervical region: Secondary | ICD-10-CM | POA: Diagnosis not present

## 2020-05-19 DIAGNOSIS — M940 Chondrocostal junction syndrome [Tietze]: Secondary | ICD-10-CM | POA: Diagnosis not present

## 2020-05-19 DIAGNOSIS — M5412 Radiculopathy, cervical region: Secondary | ICD-10-CM | POA: Diagnosis not present

## 2020-05-20 DIAGNOSIS — M9902 Segmental and somatic dysfunction of thoracic region: Secondary | ICD-10-CM | POA: Diagnosis not present

## 2020-05-20 DIAGNOSIS — M9901 Segmental and somatic dysfunction of cervical region: Secondary | ICD-10-CM | POA: Diagnosis not present

## 2020-05-20 DIAGNOSIS — M5412 Radiculopathy, cervical region: Secondary | ICD-10-CM | POA: Diagnosis not present

## 2020-05-20 DIAGNOSIS — M542 Cervicalgia: Secondary | ICD-10-CM | POA: Diagnosis not present

## 2020-05-25 DIAGNOSIS — M542 Cervicalgia: Secondary | ICD-10-CM | POA: Diagnosis not present

## 2020-05-25 DIAGNOSIS — M9902 Segmental and somatic dysfunction of thoracic region: Secondary | ICD-10-CM | POA: Diagnosis not present

## 2020-05-25 DIAGNOSIS — M5412 Radiculopathy, cervical region: Secondary | ICD-10-CM | POA: Diagnosis not present

## 2020-05-25 DIAGNOSIS — M9901 Segmental and somatic dysfunction of cervical region: Secondary | ICD-10-CM | POA: Diagnosis not present

## 2020-05-27 DIAGNOSIS — M9902 Segmental and somatic dysfunction of thoracic region: Secondary | ICD-10-CM | POA: Diagnosis not present

## 2020-05-27 DIAGNOSIS — M5412 Radiculopathy, cervical region: Secondary | ICD-10-CM | POA: Diagnosis not present

## 2020-05-27 DIAGNOSIS — M9901 Segmental and somatic dysfunction of cervical region: Secondary | ICD-10-CM | POA: Diagnosis not present

## 2020-05-27 DIAGNOSIS — M542 Cervicalgia: Secondary | ICD-10-CM | POA: Diagnosis not present

## 2020-06-01 DIAGNOSIS — M9901 Segmental and somatic dysfunction of cervical region: Secondary | ICD-10-CM | POA: Diagnosis not present

## 2020-06-01 DIAGNOSIS — M542 Cervicalgia: Secondary | ICD-10-CM | POA: Diagnosis not present

## 2020-06-01 DIAGNOSIS — M5412 Radiculopathy, cervical region: Secondary | ICD-10-CM | POA: Diagnosis not present

## 2020-06-01 DIAGNOSIS — M9902 Segmental and somatic dysfunction of thoracic region: Secondary | ICD-10-CM | POA: Diagnosis not present

## 2020-06-03 DIAGNOSIS — M542 Cervicalgia: Secondary | ICD-10-CM | POA: Diagnosis not present

## 2020-06-03 DIAGNOSIS — M5412 Radiculopathy, cervical region: Secondary | ICD-10-CM | POA: Diagnosis not present

## 2020-06-03 DIAGNOSIS — M9901 Segmental and somatic dysfunction of cervical region: Secondary | ICD-10-CM | POA: Diagnosis not present

## 2020-06-03 DIAGNOSIS — M9902 Segmental and somatic dysfunction of thoracic region: Secondary | ICD-10-CM | POA: Diagnosis not present

## 2020-06-07 ENCOUNTER — Ambulatory Visit: Payer: BC Managed Care – PPO | Admitting: Family Medicine

## 2020-06-07 ENCOUNTER — Encounter: Payer: Self-pay | Admitting: Family Medicine

## 2020-06-07 ENCOUNTER — Ambulatory Visit (INDEPENDENT_AMBULATORY_CARE_PROVIDER_SITE_OTHER): Payer: BC Managed Care – PPO

## 2020-06-07 ENCOUNTER — Other Ambulatory Visit: Payer: Self-pay

## 2020-06-07 ENCOUNTER — Ambulatory Visit: Payer: Self-pay

## 2020-06-07 VITALS — BP 132/80 | HR 62 | Ht 76.0 in | Wt 213.0 lb

## 2020-06-07 DIAGNOSIS — M25511 Pain in right shoulder: Secondary | ICD-10-CM

## 2020-06-07 NOTE — Progress Notes (Signed)
I, Christoper Fabian, LAT, ATC, am serving as scribe for Dr. Clementeen Graham.  Paul [name] is a 34 y.o. male who presents to [facility] today for f/u R shoulder pain.  He was last seen by Dr. Katrinka Blazing on 06/10/19 and was provided a HEP and prescribed colchicine and prednisone.  He was also seen by pulmonology on 01/20/20 and Rheumatology on 04/07/20.  Since his last visit, pt reports that he continues to have chronic pain in sternum. Has been doing physical therapy. Is unsure if physical therapy is helping. Patient is now experiencing right shoulder pain with end range flexion and IR. Also notes pain in right side of cervical spine. Pain is located in right scapula and superior aspect of shoulder. Notes pain with inspiration in right side of rib cage. Was using chiropractor to maintain spine and shoulder health. Has not gone due to COVID. Also has cut out mountain biking but does not feel as if his pain has improved.   Diagnostic testing: Chest CT- 02/04/20, 07/28/19; Sternum MRI- 04/14/19   Pertinent review of systems: No fevers or chills  Relevant historical information: Lynch syndrome.  Costochondritic chest pain.   Exam:  BP 132/80   Pulse 62   Ht 6\' 4"  (1.93 m)   Wt 213 lb (96.6 kg)   SpO2 98%   BMI 25.93 kg/m  General: Well Developed, well nourished, and in no acute distress.   MSK: Right shoulder normal. Motion full abduction.  Normal external rotation internal rotation limited to posterior iliac crest. Strength intact abduction external and internal rotation. Mildly positive Hawkins and Neer's test.  Negative Yergason's and speeds test negative empty can test.  Tender palpation right rhomboid Pulses cap refill and sensation are intact distally.   Lab and Radiology Results  X-ray images right shoulder obtained today personally and independently reviewed Normal-appearing no fracture or malalignment.  No significant DJD. Await formal radiology  review  Diagnostic Limited MSK Ultrasound of: Right shoulder Biceps tendon intact normal-appearing Subscapularis tendon is intact without retraction or significant abnormality. Supraspinatus tendon hypoechoic change superficial bursal surface consistent with either large bursitis and patches or tiny partial-thickness bursal sided tear.  No supraspinatus retraction. Infraspinatus tendon normal-appearing AC joint normal. Impression: Subacromial bursitis.  Possible tiny supraspinatus incomplete tear     Assessment and Plan: 34 y.o. male with right shoulder pain ongoing for a month or 2.  This occurs in the setting of costochondritis chest pain occurring since last year.  He is already received a little bit of physical therapy for this with [facility].  Plan to extend PT and if not better in a month proceed with MRI to further characterize cause of pain.  Recheck following MRI if needed. Chronic issue with exacerbation.     Orders Placed This Encounter  Procedures  . 34 LIMITED JOINT SPACE STRUCTURES UP RIGHT(NO LINKED CHARGES)    Order Specific Question:   Reason for Exam (SYMPTOM  OR DIAGNOSIS REQUIRED)    Answer:   right shoulder pain    Order Specific Question:   Preferred imaging location?    Answer:   [facility]  . DG Shoulder Right    Standing Status:   Future    Number of Occurrences:   1    Standing Expiration Date:   06/07/2021    Order Specific Question:   Reason for Exam (SYMPTOM  OR DIAGNOSIS REQUIRED)    Answer:   eval shulder pain  Order Specific Question:   Preferred imaging location?    Answer:   [facility]    Order Specific Question:   Radiology Contrast Protocol - do NOT remove file path    Answer:   [email or web address]  . Ambulatory referral to Physical Therapy    Referral Priority:   Routine    Referral Type:   Physical Medicine    Referral Reason:    Specialty Services Required    Requested Specialty:   Physical Therapy    Number of Visits Requested:   1   No orders of the defined types were placed in this encounter.    Discussed warning signs or symptoms. Please see discharge instructions. Patient expresses understanding.   The above documentation has been reviewed and is accurate and complete Clementeen Graham, M.D.

## 2020-06-07 NOTE — Patient Instructions (Addendum)
Thank you for coming in today.  Plan for xray today.  Extend PT.  If not improving let me know and I will proceed to MRI.  Recheck after MRI if needed.

## 2020-06-07 NOTE — Progress Notes (Signed)
X-ray shoulder looks normal to radiology

## 2020-06-08 DIAGNOSIS — M5412 Radiculopathy, cervical region: Secondary | ICD-10-CM | POA: Diagnosis not present

## 2020-06-08 DIAGNOSIS — M9901 Segmental and somatic dysfunction of cervical region: Secondary | ICD-10-CM | POA: Diagnosis not present

## 2020-06-08 DIAGNOSIS — M542 Cervicalgia: Secondary | ICD-10-CM | POA: Diagnosis not present

## 2020-06-08 DIAGNOSIS — M9902 Segmental and somatic dysfunction of thoracic region: Secondary | ICD-10-CM | POA: Diagnosis not present

## 2020-06-10 DIAGNOSIS — M9901 Segmental and somatic dysfunction of cervical region: Secondary | ICD-10-CM | POA: Diagnosis not present

## 2020-06-10 DIAGNOSIS — M542 Cervicalgia: Secondary | ICD-10-CM | POA: Diagnosis not present

## 2020-06-10 DIAGNOSIS — M5412 Radiculopathy, cervical region: Secondary | ICD-10-CM | POA: Diagnosis not present

## 2020-06-10 DIAGNOSIS — M9902 Segmental and somatic dysfunction of thoracic region: Secondary | ICD-10-CM | POA: Diagnosis not present

## 2020-06-16 DIAGNOSIS — M25511 Pain in right shoulder: Secondary | ICD-10-CM

## 2020-06-17 DIAGNOSIS — M9902 Segmental and somatic dysfunction of thoracic region: Secondary | ICD-10-CM | POA: Diagnosis not present

## 2020-06-17 DIAGNOSIS — M5412 Radiculopathy, cervical region: Secondary | ICD-10-CM | POA: Diagnosis not present

## 2020-06-17 DIAGNOSIS — M9901 Segmental and somatic dysfunction of cervical region: Secondary | ICD-10-CM | POA: Diagnosis not present

## 2020-06-17 DIAGNOSIS — M542 Cervicalgia: Secondary | ICD-10-CM | POA: Diagnosis not present

## 2020-06-23 DIAGNOSIS — F1020 Alcohol dependence, uncomplicated: Secondary | ICD-10-CM | POA: Diagnosis not present

## 2020-06-24 DIAGNOSIS — M542 Cervicalgia: Secondary | ICD-10-CM | POA: Diagnosis not present

## 2020-06-24 DIAGNOSIS — M25511 Pain in right shoulder: Secondary | ICD-10-CM | POA: Diagnosis not present

## 2020-06-24 DIAGNOSIS — M9902 Segmental and somatic dysfunction of thoracic region: Secondary | ICD-10-CM | POA: Diagnosis not present

## 2020-06-24 DIAGNOSIS — M5412 Radiculopathy, cervical region: Secondary | ICD-10-CM | POA: Diagnosis not present

## 2020-06-24 DIAGNOSIS — M940 Chondrocostal junction syndrome [Tietze]: Secondary | ICD-10-CM | POA: Diagnosis not present

## 2020-06-24 DIAGNOSIS — M9901 Segmental and somatic dysfunction of cervical region: Secondary | ICD-10-CM | POA: Diagnosis not present

## 2020-07-01 ENCOUNTER — Ambulatory Visit: Payer: BC Managed Care – PPO | Admitting: Family Medicine

## 2020-07-01 ENCOUNTER — Other Ambulatory Visit: Payer: Self-pay

## 2020-07-01 ENCOUNTER — Encounter: Payer: Self-pay | Admitting: Family Medicine

## 2020-07-01 VITALS — BP 104/64 | HR 72 | Temp 99.0°F | Ht 76.0 in | Wt 206.0 lb

## 2020-07-01 DIAGNOSIS — M542 Cervicalgia: Secondary | ICD-10-CM | POA: Diagnosis not present

## 2020-07-01 DIAGNOSIS — Z23 Encounter for immunization: Secondary | ICD-10-CM | POA: Diagnosis not present

## 2020-07-01 DIAGNOSIS — M9902 Segmental and somatic dysfunction of thoracic region: Secondary | ICD-10-CM | POA: Diagnosis not present

## 2020-07-01 DIAGNOSIS — M9901 Segmental and somatic dysfunction of cervical region: Secondary | ICD-10-CM | POA: Diagnosis not present

## 2020-07-01 DIAGNOSIS — Z1509 Genetic susceptibility to other malignant neoplasm: Secondary | ICD-10-CM

## 2020-07-01 DIAGNOSIS — M5412 Radiculopathy, cervical region: Secondary | ICD-10-CM | POA: Diagnosis not present

## 2020-07-01 LAB — URINALYSIS, ROUTINE W REFLEX MICROSCOPIC
Bilirubin Urine: NEGATIVE
Hgb urine dipstick: NEGATIVE
Ketones, ur: NEGATIVE
Leukocytes,Ua: NEGATIVE
Nitrite: NEGATIVE
RBC / HPF: NONE SEEN (ref 0–?)
Specific Gravity, Urine: 1.02 (ref 1.000–1.030)
Total Protein, Urine: NEGATIVE
Urine Glucose: NEGATIVE
Urobilinogen, UA: 0.2 (ref 0.0–1.0)
pH: 6.5 (ref 5.0–8.0)

## 2020-07-01 LAB — PSA: PSA: 0.24 ng/mL (ref 0.10–4.00)

## 2020-07-01 NOTE — Patient Instructions (Signed)
Great to see you .  Congratulations!!  We will inform you of results once we get them.

## 2020-07-01 NOTE — Progress Notes (Signed)
This visit occurred during the SARS-CoV-2 public health emergency.  Safety protocols were in place, including screening questions prior to the visit, additional usage of staff PPE, and extensive cleaning of exam room while observing appropriate contact time as indicated for disinfecting solutions.    Paul [name] , Jan 11, 1986, 34 y.o., male MRN: [id number] Patient Care Team    Relationship Specialty Notifications Start End  Ma Hillock, DO PCP - General Family Medicine  05/04/15     Chief Complaint  Patient presents with  . Immunizations    pt is requesting tdap     Subjective: Pt presents for an OV for desire of tdap and PSA follow up.  Lynch syndrome: Pt is recommended to have PSA yearly with urinalysis for his lynch syndrome history. He is overdue- last 1.5 years.   Vaccine need: Pt and his wife are expecting a new baby girl (their 37st) Dec. 11. He would like to updated his TDap today. His tetanus (Td) was last given > 5 yrs ago and he has not had a TDAP since he was a child.   Depression screen [facility] 2/9 07/01/2020 05/21/2018 05/08/2016  Decreased Interest 0 0 0  Down, Depressed, Hopeless 0 0 0  PHQ - 2 Score 0 0 0    No Known Allergies Social History   Social History Narrative   Originally from  [place]. Married to [place].    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    Wears his seatbelt. Wears a bike helmet.    Exercises more than 3 times a week.    Has a smoke alarm the home.    Has firearms in the home.   Past Medical History:  Diagnosis Date  . Anxiety   . Arthritis    Costocgondritis  . Back pain    03/17/12 xray: minimal compression deformity L1. 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  . Chickenpox    childhood   . Family history of breast cancer   . Family history of lung cancer   . Family history of Lynch syndrome   . Heart murmur    present since childhood, normal echo 2002  . IBS (irritable  bowel syndrome) 2017   bloody stool x1  . Insomnia 2017  . Psoriasis of scalp    Past Surgical History:  Procedure Laterality Date  . WISDOM TOOTH EXTRACTION     Family History  Problem Relation Age of Onset  . Alcohol abuse Mother   . Other Mother        Lynch syndrome- PMS2 nutation   . Lung cancer Father 29       nonsmoker  . Breast cancer Maternal Grandmother 7  . Endometrial cancer Maternal Great-grandmother 57  . Ovarian cancer Maternal Great-grandmother 5  . Cancer Other 54       urothelial cancer  . Other Sister        Lynch syndrome   . Healthy Brother   . Colon cancer Neg Hx   . Prostate cancer Neg Hx   . Pancreatic cancer Neg Hx   . Gastric cancer Neg Hx   . Stomach cancer Neg Hx   . Esophageal cancer Neg Hx    Allergies as of 07/01/2020   No Known Allergies     Medication List       Accurate as of July 01, 2020  9:38 AM. If you have any questions, ask your nurse or  doctor.        Vitamin D (Ergocalciferol) 1.25 MG (50000 UNIT) Caps capsule Commonly known as: DRISDOL TAKE 1 CAPSULE (50,000 UNITS TOTAL) BY MOUTH EVERY 7 (SEVEN) DAYS.       All past medical history, surgical history, allergies, family history, immunizations andmedications were updated in the EMR today and reviewed under the history and medication portions of their EMR.     ROS: Negative, with the exception of above mentioned in HPI   Objective:  BP 104/64   Pulse 72   Temp 99 F (37.2 C) (Oral)   Ht 6' 4" (1.93 m)   Wt 206 lb (93.4 kg)   SpO2 98%   BMI 25.08 kg/m  Body mass index is 25.08 kg/m. Gen: Afebrile. No acute distress. Nontoxic in appearance, well developed, well nourished.  HENT: AT. [place]. Eyes:Pupils Equal Round Reactive to light, Extraocular movements intact,  Conjunctiva without redness, discharge or icterus. CV: RRR  Chest: CTAB, no wheeze or crackles. Good air movement, normal resp effort.  Neuro: Normal gait.  Alert. Oriented x3  Psych: Normal affect,  dress and demeanor. Normal speech. Normal thought content and judgment.  No exam data present No results found. No results found for this or any previous visit (from the past 24 hour(s)).  Assessment/Plan: Matson Welch is a 34 y.o. male present for OV for   Need for Tdap vaccination tdap provided today. Baby Naomi due 12/11   Lynch syndrome Colonoscopy due 2023 - PSA - Urinalysis, Routine w reflex microscopic   Reviewed expectations re: course of current medical issues.  Discussed self-management of symptoms.  Outlined signs and symptoms indicating need for more acute intervention.  Patient verbalized understanding and all questions were answered.  Patient received an After-Visit Summary.    Orders Placed This Encounter  Procedures  . Tdap vaccine greater than or equal to 7yo IM  . PSA  . Urinalysis, Routine w reflex microscopic   No orders of the defined types were placed in this encounter.  Referral Orders  No referral(s) requested today     Note is dictated utilizing voice recognition software. Although note has been proof read prior to signing, occasional typographical errors still can be missed. If any questions arise, please do not hesitate to call for verification.   electronically signed by:  Howard Pouch, DO  [place]

## 2020-07-07 DIAGNOSIS — F1020 Alcohol dependence, uncomplicated: Secondary | ICD-10-CM | POA: Diagnosis not present

## 2020-07-08 DIAGNOSIS — M9902 Segmental and somatic dysfunction of thoracic region: Secondary | ICD-10-CM | POA: Diagnosis not present

## 2020-07-08 DIAGNOSIS — M542 Cervicalgia: Secondary | ICD-10-CM | POA: Diagnosis not present

## 2020-07-08 DIAGNOSIS — M9901 Segmental and somatic dysfunction of cervical region: Secondary | ICD-10-CM | POA: Diagnosis not present

## 2020-07-08 DIAGNOSIS — M5412 Radiculopathy, cervical region: Secondary | ICD-10-CM | POA: Diagnosis not present

## 2020-07-21 DIAGNOSIS — R293 Abnormal posture: Secondary | ICD-10-CM | POA: Diagnosis not present

## 2020-07-21 DIAGNOSIS — M25511 Pain in right shoulder: Secondary | ICD-10-CM | POA: Diagnosis not present

## 2020-07-21 DIAGNOSIS — M25611 Stiffness of right shoulder, not elsewhere classified: Secondary | ICD-10-CM | POA: Diagnosis not present

## 2020-07-22 DIAGNOSIS — M5412 Radiculopathy, cervical region: Secondary | ICD-10-CM | POA: Diagnosis not present

## 2020-07-22 DIAGNOSIS — M9902 Segmental and somatic dysfunction of thoracic region: Secondary | ICD-10-CM | POA: Diagnosis not present

## 2020-07-22 DIAGNOSIS — M542 Cervicalgia: Secondary | ICD-10-CM | POA: Diagnosis not present

## 2020-07-22 DIAGNOSIS — M9901 Segmental and somatic dysfunction of cervical region: Secondary | ICD-10-CM | POA: Diagnosis not present

## 2020-07-23 DIAGNOSIS — M25611 Stiffness of right shoulder, not elsewhere classified: Secondary | ICD-10-CM | POA: Diagnosis not present

## 2020-07-23 DIAGNOSIS — M25511 Pain in right shoulder: Secondary | ICD-10-CM | POA: Diagnosis not present

## 2020-07-23 DIAGNOSIS — R293 Abnormal posture: Secondary | ICD-10-CM | POA: Diagnosis not present

## 2020-07-27 DIAGNOSIS — M25611 Stiffness of right shoulder, not elsewhere classified: Secondary | ICD-10-CM | POA: Diagnosis not present

## 2020-07-27 DIAGNOSIS — R293 Abnormal posture: Secondary | ICD-10-CM | POA: Diagnosis not present

## 2020-07-27 DIAGNOSIS — M25511 Pain in right shoulder: Secondary | ICD-10-CM | POA: Diagnosis not present

## 2020-07-28 DIAGNOSIS — F1020 Alcohol dependence, uncomplicated: Secondary | ICD-10-CM | POA: Diagnosis not present

## 2020-07-29 DIAGNOSIS — R293 Abnormal posture: Secondary | ICD-10-CM | POA: Diagnosis not present

## 2020-07-29 DIAGNOSIS — M25511 Pain in right shoulder: Secondary | ICD-10-CM | POA: Diagnosis not present

## 2020-07-29 DIAGNOSIS — M25611 Stiffness of right shoulder, not elsewhere classified: Secondary | ICD-10-CM | POA: Diagnosis not present

## 2020-08-02 DIAGNOSIS — M25611 Stiffness of right shoulder, not elsewhere classified: Secondary | ICD-10-CM | POA: Diagnosis not present

## 2020-08-02 DIAGNOSIS — M25511 Pain in right shoulder: Secondary | ICD-10-CM | POA: Diagnosis not present

## 2020-08-02 DIAGNOSIS — R293 Abnormal posture: Secondary | ICD-10-CM | POA: Diagnosis not present

## 2020-08-03 DIAGNOSIS — M9901 Segmental and somatic dysfunction of cervical region: Secondary | ICD-10-CM | POA: Diagnosis not present

## 2020-08-03 DIAGNOSIS — M542 Cervicalgia: Secondary | ICD-10-CM | POA: Diagnosis not present

## 2020-08-03 DIAGNOSIS — M9902 Segmental and somatic dysfunction of thoracic region: Secondary | ICD-10-CM | POA: Diagnosis not present

## 2020-08-03 DIAGNOSIS — M5412 Radiculopathy, cervical region: Secondary | ICD-10-CM | POA: Diagnosis not present

## 2020-08-04 DIAGNOSIS — M25611 Stiffness of right shoulder, not elsewhere classified: Secondary | ICD-10-CM | POA: Diagnosis not present

## 2020-08-04 DIAGNOSIS — R293 Abnormal posture: Secondary | ICD-10-CM | POA: Diagnosis not present

## 2020-08-04 DIAGNOSIS — M25511 Pain in right shoulder: Secondary | ICD-10-CM | POA: Diagnosis not present

## 2020-08-18 DIAGNOSIS — M9901 Segmental and somatic dysfunction of cervical region: Secondary | ICD-10-CM | POA: Diagnosis not present

## 2020-08-18 DIAGNOSIS — M542 Cervicalgia: Secondary | ICD-10-CM | POA: Diagnosis not present

## 2020-08-18 DIAGNOSIS — M9902 Segmental and somatic dysfunction of thoracic region: Secondary | ICD-10-CM | POA: Diagnosis not present

## 2020-08-18 DIAGNOSIS — F1020 Alcohol dependence, uncomplicated: Secondary | ICD-10-CM | POA: Diagnosis not present

## 2020-08-18 DIAGNOSIS — M5412 Radiculopathy, cervical region: Secondary | ICD-10-CM | POA: Diagnosis not present

## 2020-08-30 DIAGNOSIS — M9902 Segmental and somatic dysfunction of thoracic region: Secondary | ICD-10-CM | POA: Diagnosis not present

## 2020-08-30 DIAGNOSIS — M9901 Segmental and somatic dysfunction of cervical region: Secondary | ICD-10-CM | POA: Diagnosis not present

## 2020-08-30 DIAGNOSIS — M5412 Radiculopathy, cervical region: Secondary | ICD-10-CM | POA: Diagnosis not present

## 2020-08-30 DIAGNOSIS — M542 Cervicalgia: Secondary | ICD-10-CM | POA: Diagnosis not present

## 2020-08-31 DIAGNOSIS — R293 Abnormal posture: Secondary | ICD-10-CM | POA: Diagnosis not present

## 2020-08-31 DIAGNOSIS — M25511 Pain in right shoulder: Secondary | ICD-10-CM | POA: Diagnosis not present

## 2020-08-31 DIAGNOSIS — M25611 Stiffness of right shoulder, not elsewhere classified: Secondary | ICD-10-CM | POA: Diagnosis not present

## 2020-09-01 DIAGNOSIS — F1020 Alcohol dependence, uncomplicated: Secondary | ICD-10-CM | POA: Diagnosis not present

## 2020-09-07 DIAGNOSIS — R293 Abnormal posture: Secondary | ICD-10-CM | POA: Diagnosis not present

## 2020-09-07 DIAGNOSIS — M25611 Stiffness of right shoulder, not elsewhere classified: Secondary | ICD-10-CM | POA: Diagnosis not present

## 2020-09-07 DIAGNOSIS — M25511 Pain in right shoulder: Secondary | ICD-10-CM | POA: Diagnosis not present

## 2020-09-09 DIAGNOSIS — M25511 Pain in right shoulder: Secondary | ICD-10-CM | POA: Diagnosis not present

## 2020-09-09 DIAGNOSIS — M25611 Stiffness of right shoulder, not elsewhere classified: Secondary | ICD-10-CM | POA: Diagnosis not present

## 2020-09-09 DIAGNOSIS — R293 Abnormal posture: Secondary | ICD-10-CM | POA: Diagnosis not present

## 2020-10-06 ENCOUNTER — Other Ambulatory Visit: Payer: Self-pay | Admitting: Family Medicine

## 2020-11-30 ENCOUNTER — Encounter: Payer: Self-pay | Admitting: Family Medicine

## 2020-11-30 ENCOUNTER — Ambulatory Visit (INDEPENDENT_AMBULATORY_CARE_PROVIDER_SITE_OTHER): Payer: BLUE CROSS/BLUE SHIELD

## 2020-11-30 ENCOUNTER — Ambulatory Visit: Payer: Self-pay

## 2020-11-30 ENCOUNTER — Other Ambulatory Visit: Payer: Self-pay

## 2020-11-30 ENCOUNTER — Ambulatory Visit (INDEPENDENT_AMBULATORY_CARE_PROVIDER_SITE_OTHER): Payer: BLUE CROSS/BLUE SHIELD | Admitting: Family Medicine

## 2020-11-30 ENCOUNTER — Other Ambulatory Visit: Payer: BLUE CROSS/BLUE SHIELD

## 2020-11-30 VITALS — BP 112/82 | HR 77 | Ht 76.0 in | Wt 207.8 lb

## 2020-11-30 DIAGNOSIS — M791 Myalgia, unspecified site: Secondary | ICD-10-CM

## 2020-11-30 DIAGNOSIS — M2550 Pain in unspecified joint: Secondary | ICD-10-CM

## 2020-11-30 DIAGNOSIS — R0789 Other chest pain: Secondary | ICD-10-CM

## 2020-11-30 DIAGNOSIS — M545 Low back pain, unspecified: Secondary | ICD-10-CM

## 2020-11-30 DIAGNOSIS — M898X1 Other specified disorders of bone, shoulder: Secondary | ICD-10-CM

## 2020-11-30 LAB — CBC
HCT: 41.8 % (ref 39.0–52.0)
Hemoglobin: 14 g/dL (ref 13.0–17.0)
MCHC: 33.6 g/dL (ref 30.0–36.0)
MCV: 85.4 fl (ref 78.0–100.0)
Platelets: 339 10*3/uL (ref 150.0–400.0)
RBC: 4.89 Mil/uL (ref 4.22–5.81)
RDW: 13.2 % (ref 11.5–15.5)
WBC: 5.2 10*3/uL (ref 4.0–10.5)

## 2020-11-30 LAB — COMPREHENSIVE METABOLIC PANEL
ALT: 17 U/L (ref 0–53)
AST: 19 U/L (ref 0–37)
Albumin: 4.9 g/dL (ref 3.5–5.2)
Alkaline Phosphatase: 37 U/L — ABNORMAL LOW (ref 39–117)
BUN: 15 mg/dL (ref 6–23)
CO2: 27 mEq/L (ref 19–32)
Calcium: 9.5 mg/dL (ref 8.4–10.5)
Chloride: 103 mEq/L (ref 96–112)
Creatinine, Ser: 1.01 mg/dL (ref 0.40–1.50)
GFR: 97.35 mL/min (ref >60.00)
Glucose, Bld: 99 mg/dL (ref 70–99)
Potassium: 3.9 mEq/L (ref 3.5–5.1)
Sodium: 139 mEq/L (ref 135–145)
Total Bilirubin: 0.6 mg/dL (ref 0.2–1.2)
Total Protein: 7.1 g/dL (ref 6.0–8.3)

## 2020-11-30 LAB — CK: Total CK: 475 U/L — ABNORMAL HIGH (ref 7–232)

## 2020-11-30 LAB — TSH: TSH: 1.7 u[IU]/mL (ref 0.35–4.50)

## 2020-11-30 LAB — VITAMIN D 25 HYDROXY (VIT D DEFICIENCY, FRACTURES): VITD: 48.22 ng/mL (ref 30.00–100.00)

## 2020-11-30 LAB — SEDIMENTATION RATE: Sed Rate: 2 mm/h (ref 0–15)

## 2020-11-30 NOTE — Progress Notes (Signed)
Vitamin D and thyroid tests are normal.  CBC is normal.  Metabolic panel is effectively normal.  CK a lab that looks at muscle inflammation is a little bit elevated which could be explained is normal if you worked out in the last day or 2.  Other labs are still pending

## 2020-11-30 NOTE — Patient Instructions (Addendum)
Thank you for coming in today.  Please get labs today before you leave  Please get an Xray today before you leave  Recheck in 1 month.   Call or go to the ER if you develop a large red swollen joint with extreme pain or oozing puss.

## 2020-11-30 NOTE — Progress Notes (Signed)
I, Paul [name], LAT, ATC, am serving as scribe for Dr. [name].  Paul [name] is a 35 y.o. male who presents to [place] at [facility] today for back and chest pain.  He was last seen by Dr. [name] on 06/07/20 for R shoulder/scapular pain and was advised to con't PT at [facility].  Prior to that, the pt was seen by Dr. [name] for sternal pain / costochondritis and received a work-up for these c/o by both pulmonology and rheumatology.  Since his last visit, pt reports R scapular pain (infraspinatus) and TL junction pain.  His lower back pain has been bothering him for approximately 3 months.  Pt states that he does a lot of mountain biking and felt like it might be associated w/ his bike posture.  He is not currently doing any specific treatment for either of these areas.  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  Still has sternum pain despite work-up and physical therapy  Still has pain bilateral trapezius at insertion site of medial superior scapular border despite physical therapy trial  New pain thoracolumbar junction midline spine.   Diagnostic testing: R shoulder XR- 06/07/20; CT chest w/o constrast- 02/04/20; CT chest w/ contrast- 07/28/19  Pertinent review of systems: No fevers or chills  Relevant historical information: Family history of Lynch syndrome   Exam:  BP 112/82 (BP Location: Right Arm, Patient Position: Sitting, Cuff Size: Normal)   Pulse 77   Ht '6\' 4"'  (1.93 m)   Wt 207 lb 12.8 oz (94.3 kg)   SpO2 98%   BMI 25.29 kg/m  General: Well Developed, well nourished, and in no acute distress.   MSK: Bilateral trapezius tender palpation at insertion site of superior medial scapular border.  There is a palpable tender band of tissue at the medial superior corner of the scapula. This is the area of tenderness that has been persistent for [place].  Anterior chest wall normal-appearing mildly tender palpation  T-L-spine:  Palpation midline around the T12-L1 region.  Tender palpation paraspinal musculature lower thoracic high lumbar area. Normal lumbar and thoracic motion.    Lab and Radiology Results  X-ray images thoracic and lumbar spine obtained today personally and independently interpreted  T-spine: No fractures visible.  L-spine: No fractures.  Minimal degenerative changes.  Loss of lumbar lordosis.  Await formal radiology review  Procedure: Real-time Ultrasound Guided Injection of right trapezius insertion site medial superior scapular border Device: Philips Affiniti 50G Images permanently stored and available for review in PACS Verbal informed consent obtained.  Discussed risks and benefits of procedure. Warned about infection bleeding damage to structures skin hypopigmentation and fat atrophy, pneumothorax among others. Patient expresses understanding and agreement Time-out conducted.   Noted no overlying erythema, induration, or other signs of local infection.   Skin prepped in a sterile fashion.   Local anesthesia: Topical Ethyl chloride.   With sterile technique and under real time ultrasound guidance:  40 mg of Kenalog and 2 mL of Marcaine injected into insertion site. Fluid seen entering the insertion site.   Completed without difficulty   Pain immediately resolved suggesting accurate placement of the medication.   Advised to call if fevers/chills, erythema, induration, drainage, or persistent bleeding.   Images permanently stored and available for review in the ultrasound unit.  Impression: Technically successful ultrasound guided injection.   Procedure: Real-time Ultrasound Guided Injection of left trapezius insertion site medial superior scapular border Device: Philips Affiniti  50G Images permanently stored and available for review in PACS Verbal informed consent obtained.  Discussed risks and benefits of procedure. Warned about infection bleeding damage to structures skin  hypopigmentation and fat atrophy, pneumothorax among others. Patient expresses understanding and agreement Time-out conducted.   Noted no overlying erythema, induration, or other signs of local infection.   Skin prepped in a sterile fashion.   Local anesthesia: Topical Ethyl chloride.   With sterile technique and under real time ultrasound guidance:  40 mg of Kenalog and 2 mL of Marcaine injected into insertion site. Fluid seen entering the insertion site.   Completed without difficulty   Pain immediately resolved suggesting accurate placement of the medication.   Advised to call if fevers/chills, erythema, induration, drainage, or persistent bleeding.   Images permanently stored and available for review in the ultrasound unit.  Impression: Technically successful ultrasound guided injection.       Assessment and Plan: 35 y.o. male with bilateral scapular/trapezius pain at insertion site superior medial scapular border.  Failing typical conservative management with physical therapy.  Plan for injections today.  Diagnostic portion of the injection revealed significant pain relief.  Hopefully will have better therapeutic response to the steroid component.  Pain in high lumbar low thoracic back likely muscle spasms.  No fractures or compression fracture visible for my interpretation today however radiology overread is still pending.  Consider physical therapy however await radiology overread.  Chest pain: Thought to be related to costochondritis.  Patient has had a good work-up already.  Patient overall has generalized myalgias and arthralgias which are not well explained and not responding to typical conservative management.  I think he is adherent of his home exercise program and if physical therapy.  I think he should benefit from a good rheumatologic work-up.  He had some limited rheum work-up 2 years ago which was negative.  I think it is time for repeat rheumatologic work-up now.  Labs  listed below.  Recheck in a month.   PDMP not reviewed this encounter. Orders Placed This Encounter  Procedures  . DG Thoracic Spine 2 View    Standing Status:   Future    Number of Occurrences:   1    Standing Expiration Date:   11/30/2021    Order Specific Question:   Reason for Exam (SYMPTOM  OR DIAGNOSIS REQUIRED)    Answer:   eval lower tspine pain    Order Specific Question:   Preferred imaging location?    Answer:   [facility]  . DG Lumbar Spine 2-3 Views    Standing Status:   Future    Number of Occurrences:   1    Standing Expiration Date:   11/30/2021    Order Specific Question:   Reason for Exam (SYMPTOM  OR DIAGNOSIS REQUIRED)    Answer:   eval high lspine pain    Order Specific Question:   Preferred imaging location?    Answer:   [facility]  . [place] LIMITED JOINT SPACE STRUCTURES UP BILAT(NO LINKED CHARGES)    Order Specific Question:   Reason for Exam (SYMPTOM  OR DIAGNOSIS REQUIRED)    Answer:   inj    Order Specific Question:   Preferred imaging location?    Answer:   [place]  . CBC    Standing Status:   Future    Number of Occurrences:   1    Standing Expiration Date:   11/30/2021  . Sedimentation  rate    Standing Status:   Future    Number of Occurrences:   1    Standing Expiration Date:   11/30/2021  . TSH    Standing Status:   Future    Number of Occurrences:   1    Standing Expiration Date:   11/30/2021  . VITAMIN D 25 Hydroxy (Vit-D Deficiency, Fractures)    Standing Status:   Future    Number of Occurrences:   1    Standing Expiration Date:   11/30/2021  . HLA-B27 antigen    Standing Status:   Future    Number of Occurrences:   1    Standing Expiration Date:   11/30/2021  . LUPUS(12) PANEL    Standing Status:   Future    Number of Occurrences:   1    Standing Expiration Date:   11/30/2021  . CK    Standing Status:   Future    Number of Occurrences:   1    Standing Expiration Date:   11/30/2021  . PTH,  Intact and Calcium    Standing Status:   Future    Number of Occurrences:   1    Standing Expiration Date:   11/30/2021  . Comp Met (CMET)    Standing Status:   Future    Number of Occurrences:   1    Standing Expiration Date:   11/30/2021   No orders of the defined types were placed in this encounter.    Discussed warning signs or symptoms. Please see discharge instructions. Patient expresses understanding.   The above documentation has been reviewed and is accurate and complete [name], M.D.

## 2020-12-01 NOTE — Progress Notes (Signed)
X-ray thoracic spine looks normal to radiology

## 2020-12-01 NOTE — Progress Notes (Signed)
Sedimentation rate is normal.

## 2020-12-01 NOTE — Progress Notes (Signed)
X-ray lumbar spine looks normal to radiology

## 2020-12-02 LAB — PTH, INTACT AND CALCIUM
Calcium: 9.6 mg/dL (ref 8.6–10.3)
PTH: 29 pg/mL (ref 16–77)

## 2020-12-02 LAB — LUPUS(12) PANEL
Anti Nuclear Antibody (ANA): NEGATIVE
C3 Complement: 98 mg/dL (ref 82–185)
C4 Complement: 26 mg/dL (ref 15–53)
ENA SM Ab Ser-aCnc: <1 AI
Rheumatoid fact SerPl-aCnc: <14 IU/mL (ref <14)
Ribosomal P Protein Ab: <1 AI
SM/RNP: <1 AI
SSA (Ro) (ENA) Antibody, IgG: <1 AI
SSB (La) (ENA) Antibody, IgG: <1 AI
Scleroderma (Scl-70) (ENA) Antibody, IgG: <1 AI
Thyroperoxidase Ab SerPl-aCnc: 1 IU/mL (ref <9)
ds DNA Ab: <1 IU/mL

## 2020-12-02 LAB — HLA-B27 ANTIGEN: HLA-B27 Antigen: NEGATIVE

## 2020-12-03 NOTE — Progress Notes (Signed)
Further rheumatologic work-up was negative

## 2020-12-31 ENCOUNTER — Encounter: Payer: Self-pay | Admitting: Family Medicine

## 2020-12-31 ENCOUNTER — Ambulatory Visit (INDEPENDENT_AMBULATORY_CARE_PROVIDER_SITE_OTHER): Payer: BLUE CROSS/BLUE SHIELD | Admitting: Family Medicine

## 2020-12-31 ENCOUNTER — Other Ambulatory Visit: Payer: Self-pay

## 2020-12-31 VITALS — BP 118/78 | HR 66 | Ht 76.0 in | Wt 205.2 lb

## 2020-12-31 DIAGNOSIS — M791 Myalgia, unspecified site: Secondary | ICD-10-CM | POA: Diagnosis not present

## 2020-12-31 DIAGNOSIS — R748 Abnormal levels of other serum enzymes: Secondary | ICD-10-CM

## 2020-12-31 LAB — CK: Total CK: 98 U/L (ref 7–232)

## 2020-12-31 MED ORDER — DULOXETINE HCL 20 MG PO CPEP: 20.0000 mg | ORAL_CAPSULE | Freq: Every day | ORAL | 1 refills | Status: DC

## 2020-12-31 NOTE — Progress Notes (Signed)
I, Molly Weber, LAT, ATC, am serving as scribe for Dr. Evan Corey.  Paul [name] is a 35 y.o. male who presents to [facility] today for f/u back and sternal pain. Pt previously received a work-up by both pulmonology and rheumatology. Pt was last seen by Dr. Corey on 11/30/20 and was given a R & L trapezius steroid injection and was advised to repeat rheumatologic work-up. Today, pt reports that the B UT injections helped for a couple weeks.  He states that his L UT is feeling generally improved but feels like his R upper trap is about the same as it was previously.  He is still having chest pain and is also reporting some low back pain.  Dx testing: 11/30/20 Labs (sed rate, CBC, TSH, vit-D, CK, CMET, HLA-B27, Lupus, PTH)  11/30/20 L-spine & T-spine XR  06/07/20 R shoulder XR  07/28/19 Chest CT  04/14/19 Sternum MRI  Pertinent review of systems: No fevers or chills  Relevant historical information: Costochondritis and scalp psoriasis.  Family history of Lynch syndrome. CK elevated recently  Exam:  BP 118/78 (BP Location: Right Arm, Patient Position: Sitting, Cuff Size: Normal)   Pulse 66   Ht 6' 4" (1.93 m)   Wt 205 lb 3.2 oz (93.1 kg)   SpO2 99%   BMI 24.98 kg/m  General: Well Developed, well nourished, and in no acute distress.   MSK: Normal cervical and thoracic motion.    Lab and Radiology Results  Lab Results  Component Value Date   CKTOTAL 475 (H) 11/30/2020       Assessment and Plan: 35 y.o. male with myalgias and arthralgias.  Unclear etiology.  Patient has had multiple different areas of myalgias and has had several different arthralgias as well.  He has had a rheumatologic work-up that was negative except for mildly elevated CK.  He has had different trials of treatment including physical therapy and injections with no significant improvement overall.  Will treat with Cymbalta for presumed fibromyalgia like pain.  Follow-up  elevated CK by repeating lab.  It is likely that the CK was elevated because he had worked out recently.  If this is still elevated we will follow this lab further.  Recheck in 1 month.   PDMP not reviewed this encounter. Orders Placed This Encounter  Procedures  . CK    Standing Status:   Future    Number of Occurrences:   1    Standing Expiration Date:   12/31/2021   Meds ordered this encounter  Medications  . DULoxetine (CYMBALTA) 20 MG capsule    Sig: Take 1 capsule (20 mg total) by mouth daily.    Dispense:  30 capsule    Refill:  1     Discussed warning signs or symptoms. Please see discharge instructions. Patient expresses understanding.   The above documentation has been reviewed and is accurate and complete Evan Corey, M.D.  Total encounter time 20 minutes including face-to-face time with the patient and, reviewing past medical record, and charting on the date of service.  Treatment plan and options    

## 2020-12-31 NOTE — Patient Instructions (Signed)
Thank you for coming in today.  Please get labs today before you leave  Start cybalta.   Check back in about a month.   Stay active.   A podcast: [facility] (PAIN) with Dr. Jeri Cos [facility]   Myofascial Pain Syndrome and Fibromyalgia Myofascial pain syndrome and fibromyalgia are both pain disorders. This pain may be felt mainly in your muscles.  Myofascial pain syndrome: ? Always has tender points in the muscle that will cause pain when pressed (trigger points). The pain may come and go. ? Usually affects your neck, upper back, and shoulder areas. The pain often radiates into your arms and hands.  Fibromyalgia: ? Has muscle pains and tenderness that come and go. ? Is often associated with fatigue and sleep problems. ? Has trigger points. ? Tends to be long-lasting (chronic), but is not life-threatening. Fibromyalgia and myofascial pain syndrome are not the same. However, they often occur together. If you have both conditions, each can make the other worse. Both are common and can cause enough pain and fatigue to make day-to-day activities difficult. Both can be hard to diagnose because their symptoms are common in many other conditions. What are the causes? The exact causes of these conditions are not known. What increases the risk? You are more likely to develop this condition if:  You have a family history of the condition.  You have certain triggers, such as: ? Spine disorders. ? An injury (trauma) or other physical stressors. ? Being under a lot of stress. ? Medical conditions such as osteoarthritis, rheumatoid arthritis, or lupus. What are the signs or symptoms? Fibromyalgia The main symptom of fibromyalgia is widespread pain and tenderness in your muscles. Pain is sometimes described as stabbing, shooting, or burning. You may also have:  Tingling or numbness.  Sleep problems and fatigue.  Problems with attention and concentration (fibro  fog). Other symptoms may include:  Bowel and bladder problems.  Headaches.  Visual problems.  Problems with odors and noises.  Depression or mood changes.  Painful menstrual periods (dysmenorrhea).  Dry skin or eyes. These symptoms can vary over time. Myofascial pain syndrome Symptoms of myofascial pain syndrome include:  Tight, ropy bands of muscle.  Uncomfortable sensations in muscle areas. These may include aching, cramping, burning, numbness, tingling, and weakness.  Difficulty moving certain parts of the body freely (poor range of motion). How is this diagnosed? This condition may be diagnosed by your symptoms and medical history. You will also have a physical exam. In general:  Fibromyalgia is diagnosed if you have pain, fatigue, and other symptoms for more than 3 months, and symptoms cannot be explained by another condition.  Myofascial pain syndrome is diagnosed if you have trigger points in your muscles, and those trigger points are tender and cause pain elsewhere in your body (referred pain). How is this treated? Treatment for these conditions depends on the type that you have.  For fibromyalgia: ? Pain medicines, such as NSAIDs. ? Medicines for treating depression. ? Medicines for treating seizures. ? Medicines that relax the muscles.  For myofascial pain: ? Pain medicines, such as NSAIDs. ? Cooling and stretching of muscles. ? Trigger point injections. ? Sound wave (ultrasound) treatments to stimulate muscles. Treating these conditions often requires a team of health care providers. These may include:  Your primary care provider.  Physical therapist.  Complementary health care providers, such as massage therapists or acupuncturists.  Psychiatrist for cognitive behavioral therapy.   Follow these instructions  at home: Medicines  Take over-the-counter and prescription medicines only as told by your health care provider.  Do not drive or use heavy  machinery while taking prescription pain medicine.  If you are taking prescription pain medicine, take actions to prevent or treat constipation. Your health care provider may recommend that you: ? Drink enough fluid to keep your urine pale yellow. ? Eat foods that are high in fiber, such as fresh fruits and vegetables, whole grains, and beans. ? Limit foods that are high in fat and processed sugars, such as fried or sweet foods. ? Take an over-the-counter or prescription medicine for constipation. Lifestyle  Exercise as directed by your health care provider or physical therapist.  Practice relaxation techniques to control your stress. You may want to try: ? Biofeedback. ? Visual imagery. ? Hypnosis. ? Muscle relaxation. ? Yoga. ? Meditation.  Maintain a healthy lifestyle. This includes eating a healthy diet and getting enough sleep.  Do not use any products that contain nicotine or tobacco, such as cigarettes and e-cigarettes. If you need help quitting, ask your health care provider.   General instructions  Talk to your health care provider about complementary treatments, such as acupuncture or massage.  Consider joining a support group with others who are diagnosed with this condition.  Do not do activities that stress or strain your muscles. This includes repetitive motions and heavy lifting.  Keep all follow-up visits as told by your health care provider. This is important. Where to find more information  National Fibromyalgia Association: www.fmaware.org  Arthritis Foundation: www.arthritis.org  American Chronic Pain Association: www.theacpa.org Contact a health care provider if:  You have new symptoms.  Your symptoms get worse or your pain is severe.  You have side effects from your medicines.  You have trouble sleeping.  Your condition is causing depression or anxiety. Summary  Myofascial pain syndrome and fibromyalgia are pain disorders.  Myofascial pain  syndrome has tender points in the muscle that will cause pain when pressed (trigger points). Fibromyalgia also has muscle pains and tenderness that come and go, but this condition is often associated with fatigue and sleep disturbances.  Fibromyalgia and myofascial pain syndrome are not the same but often occur together, causing pain and fatigue that make day-to-day activities difficult.  Treatment for fibromyalgia includes taking medicines to relax the muscles and medicines for pain, depression, or seizures. Treatment for myofascial pain syndrome includes taking medicines for pain, cooling and stretching of muscles, and injecting medicines into trigger points.  Follow your health care provider's instructions for taking medicines and maintaining a healthy lifestyle. This information is not intended to replace advice given to you by your health care provider. Make sure you discuss any questions you have with your health care provider. Document Revised: 12/20/2018 Document Reviewed: 09/12/2017 Elsevier Patient Education  2021 Elsevier Inc.   Duloxetine Delayed-Release Capsules What is this medicine? DULOXETINE (doo LOX e teen) is used to treat depression, anxiety, and different types of chronic pain. This medicine may be used for other purposes; ask your health care provider or pharmacist if you have questions. COMMON BRAND NAME(S): Cymbalta, Murrell Converse, Irenka What should I tell my health care provider before I take this medicine? They need to know if you have any of these conditions:  bipolar disorder  glaucoma  high blood pressure  kidney disease  liver disease  seizures  suicidal thoughts, plans or attempt; a previous suicide attempt by you or a family member  take medicines that treat  or prevent blood clots  taken medicines called MAOIs like Carbex, Eldepryl, Marplan, Nardil, and Parnate within 14 days  trouble passing urine  an unusual reaction to duloxetine, other  medicines, foods, dyes, or preservatives  pregnant or trying to get pregnant  breast-feeding How should I use this medicine? Take this medicine by mouth with a glass of water. Follow the directions on the prescription label. Do not crush, cut or chew some capsules of this medicine. Some capsules may be opened and sprinkled on applesauce. Check with your doctor or pharmacist if you are not sure. You can take this medicine with or without food. Take your medicine at regular intervals. Do not take your medicine more often than directed. Do not stop taking this medicine suddenly except upon the advice of your doctor. Stopping this medicine too quickly may cause serious side effects or your condition may worsen. A special MedGuide will be given to you by the pharmacist with each prescription and refill. Be sure to read this information carefully each time. Talk to your pediatrician regarding the use of this medicine in children. While this drug may be prescribed for children as young as 69 years of age for selected conditions, precautions do apply. Overdosage: If you think you have taken too much of this medicine contact a poison control center or emergency room at once. NOTE: This medicine is only for you. Do not share this medicine with others. What if I miss a dose? If you miss a dose, take it as soon as you can. If it is almost time for your next dose, take only that dose. Do not take double or extra doses. What may interact with this medicine? Do not take this medicine with any of the following medications:  desvenlafaxine  levomilnacipran  linezolid  MAOIs like Carbex, Eldepryl, Emsam, Marplan, Nardil, and Parnate  methylene blue (injected into a vein)  milnacipran  safinamide  thioridazine  venlafaxine  viloxazine This medicine may also interact with the following medications:  alcohol  amphetamines  aspirin and aspirin-like medicines  certain antibiotics like  ciprofloxacin and enoxacin  certain medicines for blood pressure, heart disease, irregular heart beat  certain medicines for depression, anxiety, or psychotic disturbances  certain medicines for migraine headache like almotriptan, eletriptan, frovatriptan, naratriptan, rizatriptan, sumatriptan, zolmitriptan  certain medicines that treat or prevent blood clots like warfarin, enoxaparin, and dalteparin  cimetidine  fentanyl  lithium  NSAIDS, medicines for pain and inflammation, like ibuprofen or naproxen  phentermine  procarbazine  rasagiline  sibutramine  St. John's wort  theophylline  tramadol  tryptophan This list may not describe all possible interactions. Give your health care provider a list of all the medicines, herbs, non-prescription drugs, or dietary supplements you use. Also tell them if you smoke, drink alcohol, or use illegal drugs. Some items may interact with your medicine. What should I watch for while using this medicine? Tell your doctor if your symptoms do not get better or if they get worse. Visit your doctor or healthcare provider for regular checks on your progress. Because it may take several weeks to see the full effects of this medicine, it is important to continue your treatment as prescribed by your doctor. This medicine may cause serious skin reactions. They can happen weeks to months after starting the medicine. Contact your healthcare provider right away if you notice fevers or flu-like symptoms with a rash. The rash may be red or purple and then turn into blisters or peeling of the  skin. Or, you might notice a red rash with swelling of the face, lips, or lymph nodes in your neck or under your arms. Patients and their families should watch out for new or worsening thoughts of suicide or depression. Also watch out for sudden changes in feelings such as feeling anxious, agitated, panicky, irritable, hostile, aggressive, impulsive, severely restless,  overly excited and hyperactive, or not being able to sleep. If this happens, especially at the beginning of treatment or after a change in dose, call your healthcare provider. You may get drowsy or dizzy. Do not drive, use machinery, or do anything that needs mental alertness until you know how this medicine affects you. Do not stand or sit up quickly, especially if you are an older patient. This reduces the risk of dizzy or fainting spells. Alcohol may interfere with the effect of this medicine. Avoid alcoholic drinks. This medicine can cause an increase in blood pressure. This medicine can also cause a sudden drop in your blood pressure, which may make you feel faint and increase the chance of a fall. These effects are most common when you first start the medicine or when the dose is increased, or during use of other medicines that can cause a sudden drop in blood pressure. Check with your doctor for instructions on monitoring your blood pressure while taking this medicine. Your mouth may get dry. Chewing sugarless gum or sucking hard candy, and drinking plenty of water, may help. Contact your doctor if the problem does not go away or is severe. What side effects may I notice from receiving this medicine? Side effects that you should report to your doctor or health care professional as soon as possible:  allergic reactions like skin rash, itching or hives, swelling of the face, lips, or tongue  anxious  breathing problems  confusion  changes in vision  chest pain  confusion  elevated mood, decreased need for sleep, racing thoughts, impulsive behavior  eye pain  fast, irregular heartbeat  feeling faint or lightheaded, falls  feeling agitated, angry, or irritable  hallucination, loss of contact with reality  high blood pressure  loss of balance or coordination  palpitations  redness, blistering, peeling or loosening of the skin, including inside the mouth  restlessness,  pacing, inability to keep still  seizures  stiff muscles  suicidal thoughts or other mood changes  trouble passing urine or change in the amount of urine  trouble sleeping  unusual bleeding or bruising  unusually weak or tired  vomiting  yellowing of the eyes or skin Side effects that usually do not require medical attention (report to your doctor or health care professional if they continue or are bothersome):  change in sex drive or performance  change in appetite or weight  constipation  dizziness  dry mouth  headache  increased sweating  nausea  tired This list may not describe all possible side effects. Call your doctor for medical advice about side effects. You may report side effects to FDA at 1-800-FDA-1088. Where should I keep my medicine? Keep out of the reach of children and pets. Store at room temperature between 15 and 30 degrees C (59 to 86 degrees F). Get rid of any unused medicine after the expiration date. To get rid of medicines that are no longer needed or have expired:  Take the medicine to a medicine take-back program. Check with your pharmacy or law enforcement to find a location.  If you cannot return the medicine, check the label  or package insert to see if the medicine should be thrown out in the garbage or flushed down the toilet. If you are not sure, ask your health care provider. If it is safe to put it in the trash, take the medicine out of the container. Mix the medicine with cat litter, dirt, coffee grounds, or other unwanted substance. Seal the mixture in a bag or container. Put it in the trash. NOTE: This sheet is a summary. It may not cover all possible information. If you have questions about this medicine, talk to your doctor, pharmacist, or health care provider.  2021 Elsevier/Gold Standard (2020-07-15 16:06:16)

## 2021-01-03 NOTE — Progress Notes (Signed)
The CK lab for muscle inflammation is normal.  I do not believe we need further rheumatologic work-up.

## 2021-01-22 ENCOUNTER — Other Ambulatory Visit: Payer: Self-pay | Admitting: Family Medicine

## 2021-01-24 NOTE — Telephone Encounter (Signed)
Rx refill request approved per Dr. Corey's orders. 

## 2021-01-27 NOTE — Progress Notes (Signed)
   I, Christoper Fabian, LAT, ATC, am serving as scribe for Dr. Clementeen Graham.  Paul [name] is a 35 y.o. male who presents to [facility] today for f/u of low back, chest and B upper trap pain.  He was last seen by Dr. Denyse Amass on 4/22/222 w/ con't c/o chest, low back and R upper trap pain and was prescribed Cymbalta.  Since his last visit, pt reports slight improvement in pain. Pt c/o pain along the medial border of R scapula. Pt is R-hand dominate. Pt notes pain in her chest has improved.  He notes he has been working from home more and that his ergonomics at work require his right shoulder to be elevated a little bit more than his left but he uses his mouse.  Diagnostic testing: L-spine and T-spine XR- 11/30/20; R shoulder XR- 06/07/20; CTchest w/o contrast- 02/04/20   Pertinent review of systems: No fevers or chills  Relevant historical information: Family history of Lynch syndrome.   Exam:  BP 130/85 (BP Location: Right Arm, Patient Position: Sitting, Cuff Size: Normal)   Pulse (!) 57   Ht 6\' 4"  (1.93 m)   Wt 202 lb 12.8 oz (92 kg)   SpO2 97%   BMI 24.69 kg/m  General: Well Developed, well nourished, and in no acute distress.   MSK: Right trapezius tender palpation at insertion onto scapula medial superior corner     Assessment and Plan: 35 y.o. male with persistent pain in chest and trapezius.  Patient has had good response to 20 mg of Cymbalta.  We will increase to 30 mg and recheck in 3 months.  Additionally discussed ergonomics at home office.  His persistently elevated right shoulder could certainly be a factor in his persistent right trapezius pain.  Recommend increasing the height of his chair a little so that his shoulder could be in a more relaxed position at home office.  Continue weight lifting and normal activities as tolerated.   PDMP not reviewed this encounter. No orders of the defined types were placed in this encounter.  Meds ordered  this encounter  Medications  . DULoxetine (CYMBALTA) 30 MG capsule    Sig: Take 1 capsule (30 mg total) by mouth daily.    Dispense:  90 capsule    Refill:  3     Discussed warning signs or symptoms. Please see discharge instructions. Patient expresses understanding.   The above documentation has been reviewed and is accurate and complete 20, M.D.  Total encounter time 20 minutes including face-to-face time with the patient and, reviewing past medical record, and charting on the date of service.   Treatment plan and options

## 2021-01-28 ENCOUNTER — Ambulatory Visit (INDEPENDENT_AMBULATORY_CARE_PROVIDER_SITE_OTHER): Payer: BLUE CROSS/BLUE SHIELD | Admitting: Family Medicine

## 2021-01-28 ENCOUNTER — Other Ambulatory Visit: Payer: Self-pay

## 2021-01-28 VITALS — BP 130/85 | HR 57 | Ht 76.0 in | Wt 202.8 lb

## 2021-01-28 DIAGNOSIS — M898X1 Other specified disorders of bone, shoulder: Secondary | ICD-10-CM | POA: Diagnosis not present

## 2021-01-28 DIAGNOSIS — M791 Myalgia, unspecified site: Secondary | ICD-10-CM

## 2021-01-28 MED ORDER — DULOXETINE HCL 30 MG PO CPEP: 30.0000 mg | ORAL_CAPSULE | Freq: Every day | ORAL | 3 refills | Status: DC

## 2021-01-28 NOTE — Patient Instructions (Signed)
Thank you for coming in today.  Increase cymbalta to 30mg  daily.   Recheck in 3 months.   Ok increase activity as tolerated.   Let me know if you have a problem.

## 2021-03-25 ENCOUNTER — Encounter: Payer: Self-pay | Admitting: Gastroenterology

## 2021-04-06 IMAGING — CT CT CHEST W/O CM
1 of 2 series · 14 of 30 positions shown, 18 images · non-contrast
Comparison: 07/28/2019 chest CT.

CLINICAL DATA: Reported abnormal chest radiograph with mediastinal
widening. Chronic chest pain.

EXAM:
CT CHEST WITHOUT CONTRAST
TECHNIQUE: Multidetector CT imaging of the chest was performed following the
standard protocol without IV contrast.

[Series 2: chest w/(date) · axial · 0.78mm/px · z∈[+747,+1089]mm · 14 of 201 slices shown, 18 images]
[im 15/201  mediastinal]
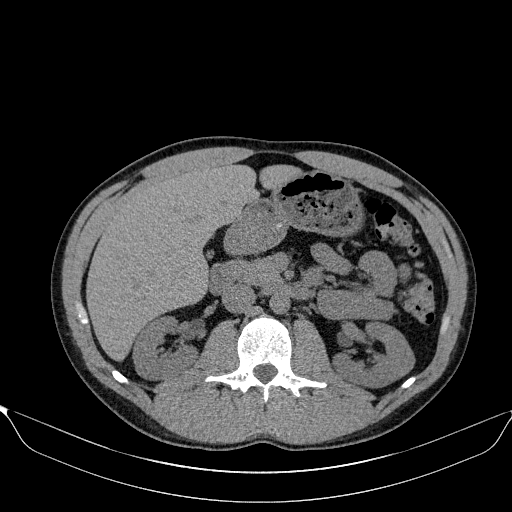
[im 15/201  lung]
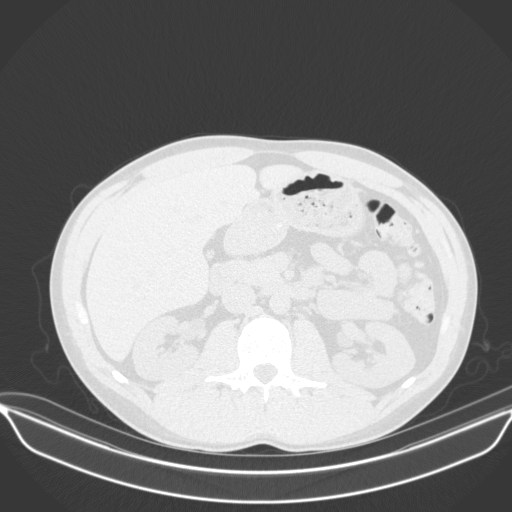
[im 29/201  lung]
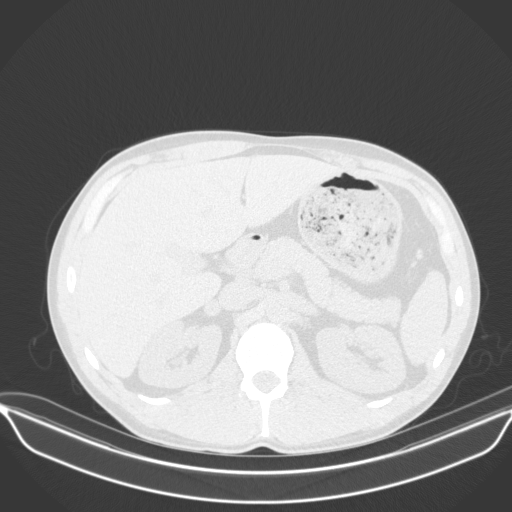
[im 43/201  lung]
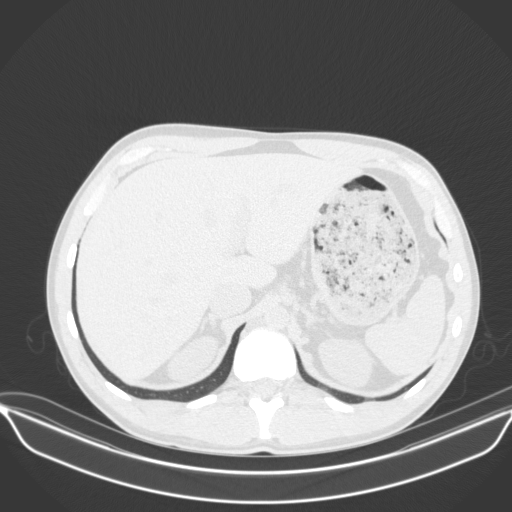
[im 58/201  lung]
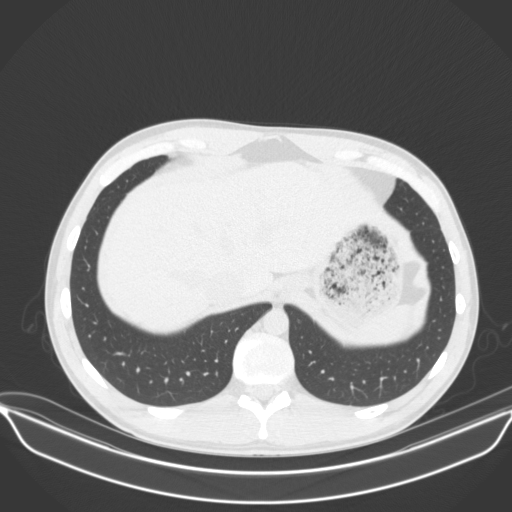
[im 72/201  mediastinal]
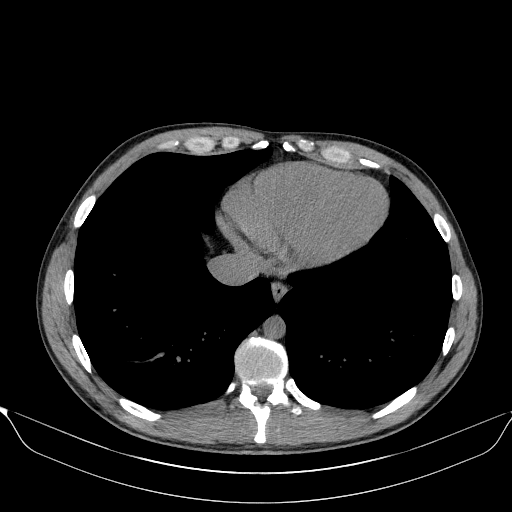
[im 72/201  lung]
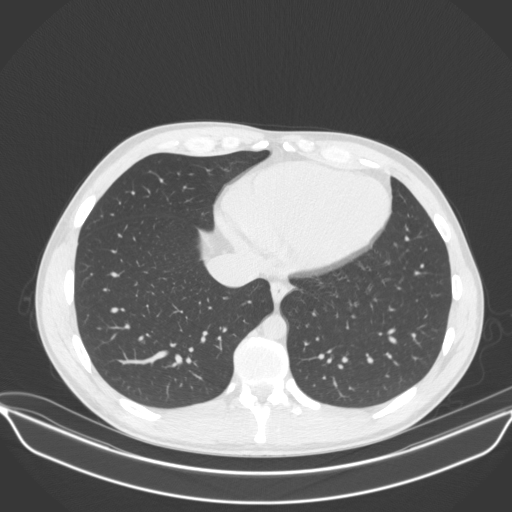
[im 86/201  lung]
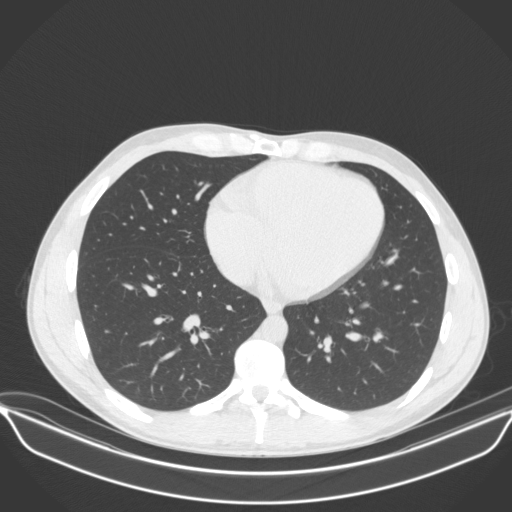
[im 97/201  lung]
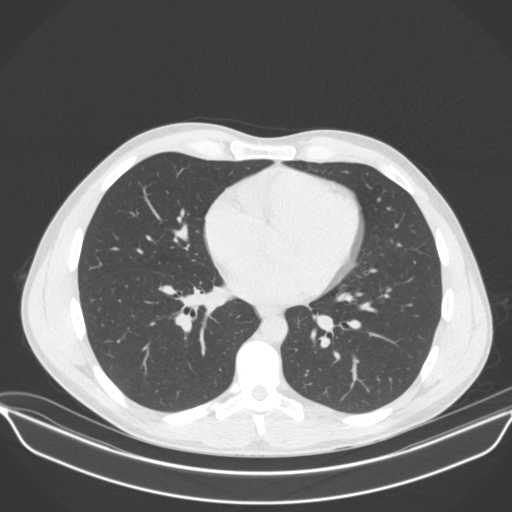
[im 101/201  lung]
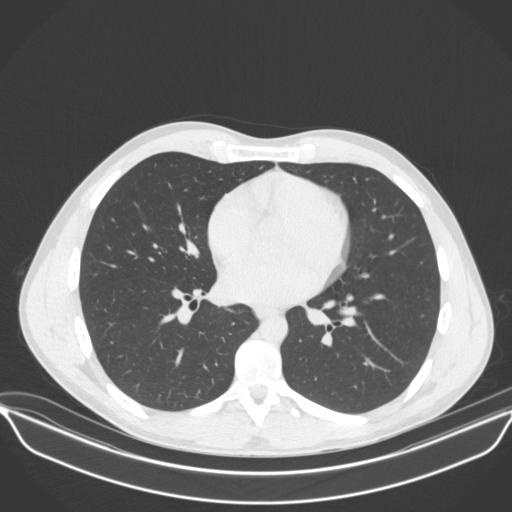
[im 115/201  mediastinal]
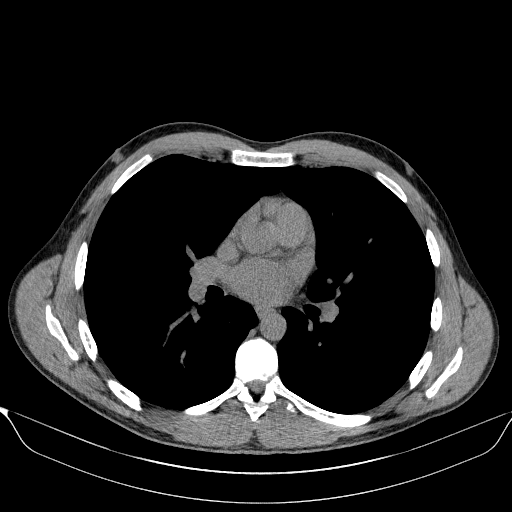
[im 115/201  lung]
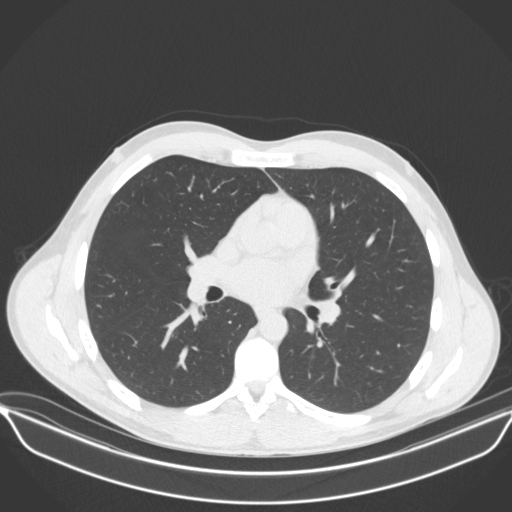
[im 129/201  lung]
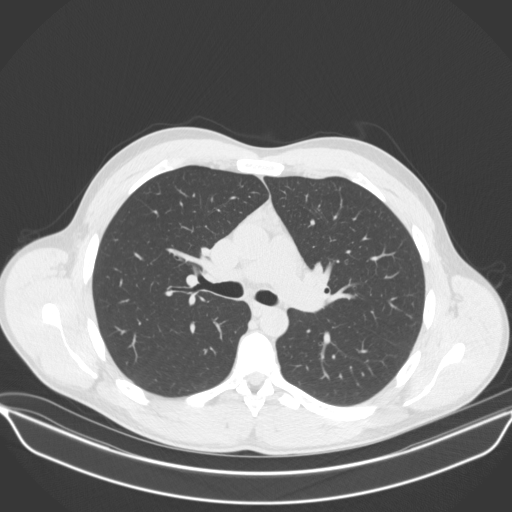
[im 143/201  lung]
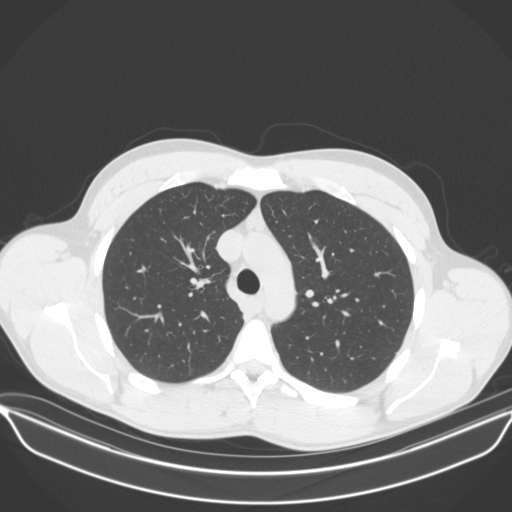
[im 158/201  lung]
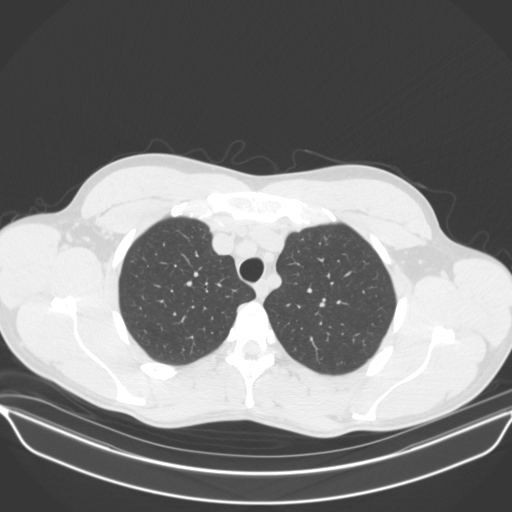
[im 172/201  mediastinal]
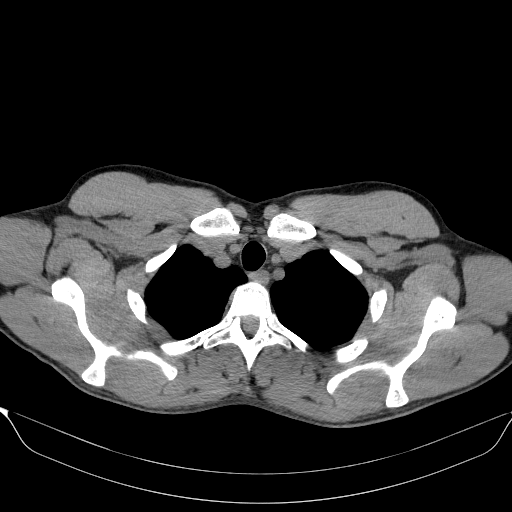
[im 172/201  lung]
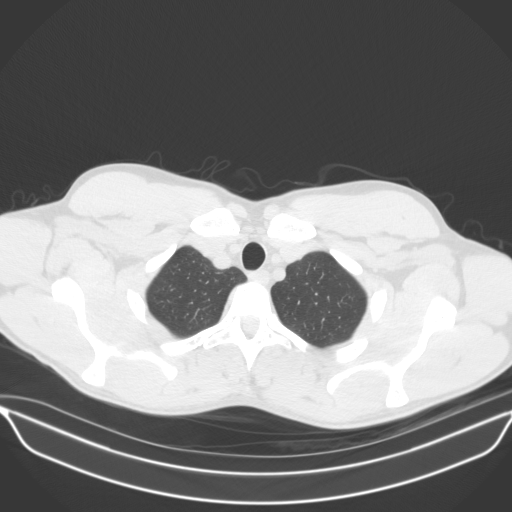
[im 186/201  lung]
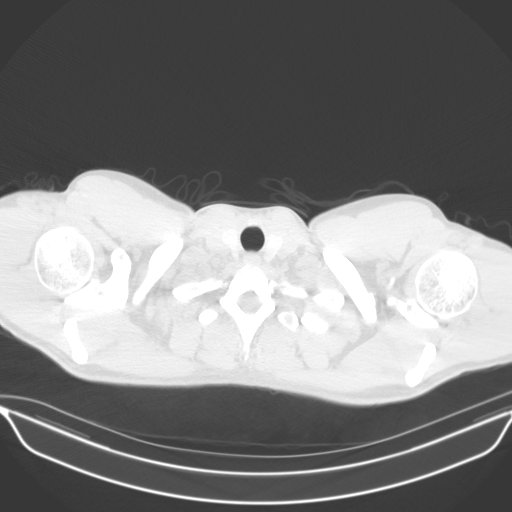

[14 of 30 positions shown; findings below may reference images not displayed]

FINDINGS: Cardiovascular: Normal heart size. No significant pericardial
effusion/thickening. Great vessels are normal in course and caliber.

Mediastinum/Nodes: No discrete thyroid nodules. Unremarkable
esophagus. No pathologically enlarged axillary, mediastinal or hilar
lymph nodes, noting limited sensitivity for the detection of hilar
adenopathy on this noncontrast study. Mild triangular mixed soft
tissue and fat attenuation in the anterior mediastinum draped over
the great vessels (series 2/image 67), unchanged, compatible with
mild thymic hyperplasia.

Lungs/Pleura: No pneumothorax. No pleural effusion. No acute
consolidative airspace disease, lung masses or significant pulmonary
nodules.

Upper abdomen: No acute abnormality.

Musculoskeletal:  No aggressive appearing focal osseous lesions.
IMPRESSION: 1. No active pulmonary disease.
2. Mild thymic hyperplasia, unchanged.
3. No thoracic adenopathy.

## 2021-04-13 ENCOUNTER — Encounter: Payer: Self-pay | Admitting: Gastroenterology

## 2021-04-13 ENCOUNTER — Encounter: Payer: Self-pay | Admitting: Family Medicine

## 2021-04-15 MED ORDER — DULOXETINE HCL 60 MG PO CPEP: 60.0000 mg | ORAL_CAPSULE | Freq: Every day | ORAL | 3 refills | Status: DC

## 2021-04-29 ENCOUNTER — Ambulatory Visit: Payer: BLUE CROSS/BLUE SHIELD | Admitting: Family Medicine

## 2021-06-02 ENCOUNTER — Ambulatory Visit (AMBULATORY_SURGERY_CENTER): Payer: Self-pay | Admitting: *Deleted

## 2021-06-02 ENCOUNTER — Encounter: Payer: Self-pay | Admitting: Gastroenterology

## 2021-06-02 ENCOUNTER — Other Ambulatory Visit: Payer: Self-pay

## 2021-06-02 VITALS — Ht 76.0 in | Wt 210.0 lb

## 2021-06-02 DIAGNOSIS — Z1509 Genetic susceptibility to other malignant neoplasm: Secondary | ICD-10-CM

## 2021-06-02 MED ORDER — PLENVU 140 G PO SOLR: 1.0000 | ORAL | 0 refills | Status: DC

## 2021-06-02 NOTE — Progress Notes (Signed)
No egg or soy allergy known to patient  No issues known to pt with past sedation with any surgeries or procedures Patient denies ever being told they had issues or difficulty with intubation  No FH of Malignant Hyperthermia Pt is not on diet pills Pt is not on  home 02  Pt is not on blood thinners  Pt denies issues with constipation  No A fib or A flutter  EMMI video to pt or via MyChart  COVID 19 guidelines implemented in [facility] today with Pt and RN   Pt is fully vaccinated  for Covid   Plenvu  Coupon given to pt in [facility] today , Code to Pharmacy and  NO PA's for preps discussed with pt In [facility] today  Discussed with pt there will be an out-of-pocket cost for prep and that varies from $0 to 70 +  dollars   Due to the COVID-19 pandemic we are asking patients to follow certain guidelines.  Pt aware of COVID protocols and [facility] guidelines   

## 2021-06-03 ENCOUNTER — Telehealth: Payer: Self-pay | Admitting: Gastroenterology

## 2021-06-03 NOTE — Telephone Encounter (Signed)
Pt was given coupon at [facility] yesterday  Insurance 350$ With coupon was 175$  Will pick up a sample of Plenvu Lot [id number] Exp 05/2022- will get from 3rd floor   [facility]

## 2021-06-03 NOTE — Telephone Encounter (Signed)
Patient called states the Plenvu medication is too expensive seeking an alternative. °

## 2021-06-15 ENCOUNTER — Telehealth: Payer: Self-pay | Admitting: Gastroenterology

## 2021-06-15 NOTE — Telephone Encounter (Signed)
Understood and agree, thanks.  - HD

## 2021-06-15 NOTE — Telephone Encounter (Signed)
Inbound call from pt requesting a call back stating that pt's daughter has hand, foot, and mouth and now pt is feeling sick. Pt wants to know if he should keep his appt. Please advise. Thank you.

## 2021-06-15 NOTE — Telephone Encounter (Signed)
Returned pt's call. States he is not feeling well and that he had just taken a Covid-19 test and that it came back positive. Procedure cancelled for 06/16/21 and rescheduled for 07/14/21 at 3p. New instructions sent via MyChart per discussion with pt. Pt instructed to call back if he has any questions.

## 2021-06-16 ENCOUNTER — Encounter: Payer: BLUE CROSS/BLUE SHIELD | Admitting: Gastroenterology

## 2021-07-14 ENCOUNTER — Encounter: Payer: Self-pay | Admitting: Gastroenterology

## 2021-07-14 ENCOUNTER — Ambulatory Visit (AMBULATORY_SURGERY_CENTER): Payer: BLUE CROSS/BLUE SHIELD | Admitting: Gastroenterology

## 2021-07-14 ENCOUNTER — Other Ambulatory Visit: Payer: Self-pay

## 2021-07-14 VITALS — BP 108/54 | HR 71 | Temp 98.6°F | Resp 13 | Ht 76.0 in | Wt 210.0 lb

## 2021-07-14 DIAGNOSIS — Z1509 Genetic susceptibility to other malignant neoplasm: Secondary | ICD-10-CM | POA: Diagnosis not present

## 2021-07-14 DIAGNOSIS — Z1211 Encounter for screening for malignant neoplasm of colon: Secondary | ICD-10-CM | POA: Diagnosis not present

## 2021-07-14 MED ORDER — SODIUM CHLORIDE 0.9 % IV SOLN: 500.0000 mL | Freq: Once | INTRAVENOUS | Status: DC

## 2021-07-14 NOTE — Progress Notes (Signed)
Report given to PACU, vss 

## 2021-07-14 NOTE — Progress Notes (Signed)
History and Physical:  This patient presents for endoscopic testing for: Encounter Diagnosis  Name Primary?   Lynch syndrome Yes    Last colonoscopy Aug 2020 without polyps  ROS: Patient denies chest pain or cough   Past Medical History: Past Medical History:  Diagnosis Date   Anxiety    Arthritis    Costocgondritis   Back pain    03/17/12 xray: minimal compression deformity L1. 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   Chickenpox    childhood    Family history of breast cancer    Family history of lung cancer    Family history of Lynch syndrome    Heart murmur    present since childhood, normal echo 2002   IBS (irritable bowel syndrome) 2017   bloody stool x1   Insomnia 2017   Psoriasis of scalp      Past Surgical History: Past Surgical History:  Procedure Laterality Date   COLONOSCOPY     WISDOM TOOTH EXTRACTION      Allergies: No Known Allergies  Outpatient Meds: Current Outpatient Medications  Medication Sig Dispense Refill   DULoxetine (CYMBALTA) 60 MG capsule Take 1 capsule (60 mg total) by mouth daily. 90 capsule 3   Multiple Vitamin (MULTIVITAMIN) tablet Take 1 tablet by mouth daily.     Current Facility-Administered Medications  Medication Dose Route Frequency Provider Last Rate Last Admin   0.9 %  sodium chloride infusion  500 mL Intravenous Once Sherrilyn Rist, MD          ___________________________________________________________________ Objective   Exam:  BP 129/65   Pulse 78   Temp 98.6 F (37 C)   Resp 11   Ht 6\' 4"  (1.93 m)   Wt 210 lb (95.3 kg)   SpO2 100%   BMI 25.56 kg/m   CV: RRR without murmur, S1/S2 Resp: clear to auscultation bilaterally, normal RR and effort noted GI: soft, no tenderness, with active bowel sounds.   Assessment: Encounter Diagnosis  Name Primary?   Lynch syndrome Yes     Plan: Colonoscopy  The benefits and risks of the planned procedure were described in detail  with the patient or (when appropriate) their health care proxy.  Risks were outlined as including, but not limited to, bleeding, infection, perforation, adverse medication reaction leading to cardiac or pulmonary decompensation, pancreatitis (if ERCP).  The limitation of incomplete mucosal visualization was also discussed.  No guarantees or warranties were given.    The patient is appropriate for an endoscopic procedure in the ambulatory setting.   - , MD

## 2021-07-14 NOTE — Patient Instructions (Signed)
YOU HAD AN ENDOSCOPIC PROCEDURE TODAY AT THE [facility]:   Refer to the procedure report that was given to you for any specific questions about what was found during the examination.  If the procedure report does not answer your questions, please call your gastroenterologist to clarify.  If you requested that your care partner not be given the details of your procedure findings, then the procedure report has been included in a sealed envelope for you to review at your convenience later.  YOU SHOULD EXPECT: Some feelings of bloating in the abdomen. Passage of more gas than usual.  Walking can help get rid of the air that was put into your GI tract during the procedure and reduce the bloating. If you had a lower endoscopy (such as a colonoscopy or flexible sigmoidoscopy) you may notice spotting of blood in your stool or on the toilet paper. If you underwent a bowel prep for your procedure, you may not have a normal bowel movement for a few days.  Please Note:  You might notice some irritation and congestion in your nose or some drainage.  This is from the oxygen used during your procedure.  There is no need for concern and it should clear up in a day or so.  SYMPTOMS TO REPORT IMMEDIATELY:   Following lower endoscopy (colonoscopy or flexible sigmoidoscopy):  Excessive amounts of blood in the stool  Significant tenderness or worsening of abdominal pains  Swelling of the abdomen that is new, acute  Fever of 100F or higher  For urgent or emergent issues, a gastroenterologist can be reached at any hour by calling [phone]. Do not use MyChart messaging for urgent concerns.    DIET:  We do recommend a small meal at first, but then you may proceed to your regular diet.  Drink plenty of fluids but you should avoid alcoholic beverages for 24 hours.  ACTIVITY:  You should plan to take it easy for the rest of today and you should NOT DRIVE or use heavy machinery until tomorrow (because  of the sedation medicines used during the test).    FOLLOW UP: Our staff will call the number listed on your records 48-72 hours following your procedure to check on you and address any questions or concerns that you may have regarding the information given to you following your procedure. If we do not reach you, we will leave a message.  We will attempt to reach you two times.  During this call, we will ask if you have developed any symptoms of COVID 19. If you develop any symptoms (ie: fever, flu-like symptoms, shortness of breath, cough etc.) before then, please call [phone].  If you test positive for Covid 19 in the 2 weeks post procedure, please call and report this information to us.    If any biopsies were taken you will be contacted by phone or by letter within the next 1-3 weeks.  Please call us at [phone] if you have not heard about the biopsies in 3 weeks.    SIGNATURES/CONFIDENTIALITY: You and/or your care partner have signed paperwork which will be entered into your electronic medical record.  These signatures attest to the fact that that the information above on your After Visit Summary has been reviewed and is understood.  Full responsibility of the confidentiality of this discharge information lies with you and/or your care-partner. 

## 2021-07-14 NOTE — Op Note (Signed)
Paul [name]: Paul [name] Procedure Date: 07/14/2021 2:48 PM MRN: [id number] Endoscopist: Mallie Mussel L. Loletha Carrow , MD Age: 35 Referring MD:  Date of Birth: 05/18/1986 Gender: Male Account #: [id number] Procedure:                Colonoscopy Indications:              Lynch Syndrome (PMS2) - no polyps on last                            colonoscopy 04/2019 Medicines:                Monitored Anesthesia Care Procedure:                Pre-Anesthesia Assessment:                           - Prior to the procedure, a History and Physical                            was performed, and patient medications and                            allergies were reviewed. The patient's tolerance of                            previous anesthesia was also reviewed. The risks                            and benefits of the procedure and the sedation                            options and risks were discussed with the patient.                            All questions were answered, and informed consent                            was obtained. Prior Anticoagulants: The patient has                            taken no previous anticoagulant or antiplatelet                            agents. ASA Grade Assessment: II - A patient with                            mild systemic disease. After reviewing the risks                            and benefits, the patient was deemed in                            satisfactory condition to undergo the procedure.  After obtaining informed consent, the colonoscope                            was passed under direct vision. Throughout the                            procedure, the patient's blood pressure, pulse, and                            oxygen saturations were monitored continuously. The                            CF [id number] #[id number] was introduced through the anus                            and advanced to the the cecum, identified by                             appendiceal orifice and ileocecal valve. The                            colonoscopy was performed without difficulty. The                            patient tolerated the procedure well. The quality                            of the bowel preparation was excellent. The                            ileocecal valve, appendiceal orifice, and rectum                            were photographed. Scope In: 3:08:22 PM Scope Out: 3:23:08 PM Scope Withdrawal Time: 0 hours 11 minutes 54 seconds  Total Procedure Duration: 0 hours 14 minutes 46 seconds  Findings:                 The perianal and digital rectal examinations were                            normal.                           The entire examined colon appeared normal on direct                            and retroflexion views. (2nd look right colon with                            NBI performed) Complications:            No immediate complications. Estimated Blood Loss:     Estimated blood loss: none. Impression:               - The entire examined colon is normal  on direct and                            retroflexion views.                           - No specimens collected. Recommendation:           - Patient has a contact number available for                            emergencies. The signs and symptoms of potential                            delayed complications were discussed with the                            patient. Return to normal activities tomorrow.                            Written discharge instructions were provided to the                            patient.                           - Resume previous diet.                           - Continue present medications.                           - Repeat colonoscopy in 2 years for screening                            purposes. Paul [name] L. Loletha Carrow, MD 07/14/2021 3:27:15 PM This report has been signed electronically.

## 2021-07-14 NOTE — Progress Notes (Signed)
CW- VS  Pt's states no medical or surgical changes since previsit or office visit.  

## 2021-07-18 ENCOUNTER — Telehealth: Payer: Self-pay | Admitting: *Deleted

## 2021-07-18 NOTE — Telephone Encounter (Signed)
  Follow up Call-  Call back number 07/14/2021 05/12/2019  Post procedure Call Back phone  # #[phone] cell [phone]  Permission to leave phone message Yes Yes  Some recent data might be hidden     Patient questions:  Do you have a fever, pain , or abdominal swelling? No. Pain Score  0 *  Have you tolerated food without any problems? Yes.    Have you been able to return to your normal activities? Yes.    Do you have any questions about your discharge instructions: Diet   No. Medications  No. Follow up visit  No.  Do you have questions or concerns about your Care? No.  Actions: * If pain score is 4 or above: No action needed, pain <4.  Have you developed a fever since your procedure? no  2.   Have you had an respiratory symptoms (SOB or cough) since your procedure? no  3.   Have you tested positive for COVID 19 since your procedure no  4.   Have you had any family members/close contacts diagnosed with the COVID 19 since your procedure?  no   If yes to any of these questions please route to Laverna Peace, RN and Karlton Lemon, RN \

## 2021-08-08 IMAGING — DX DG SHOULDER 2+V*R*
3 series · 3 of 3 positions shown · non-contrast
Comparison: None.

CLINICAL DATA: Pain for 1 month

EXAM:
RIGHT SHOULDER - 2+ VIEW

[shoulder ap (1 of 2)]
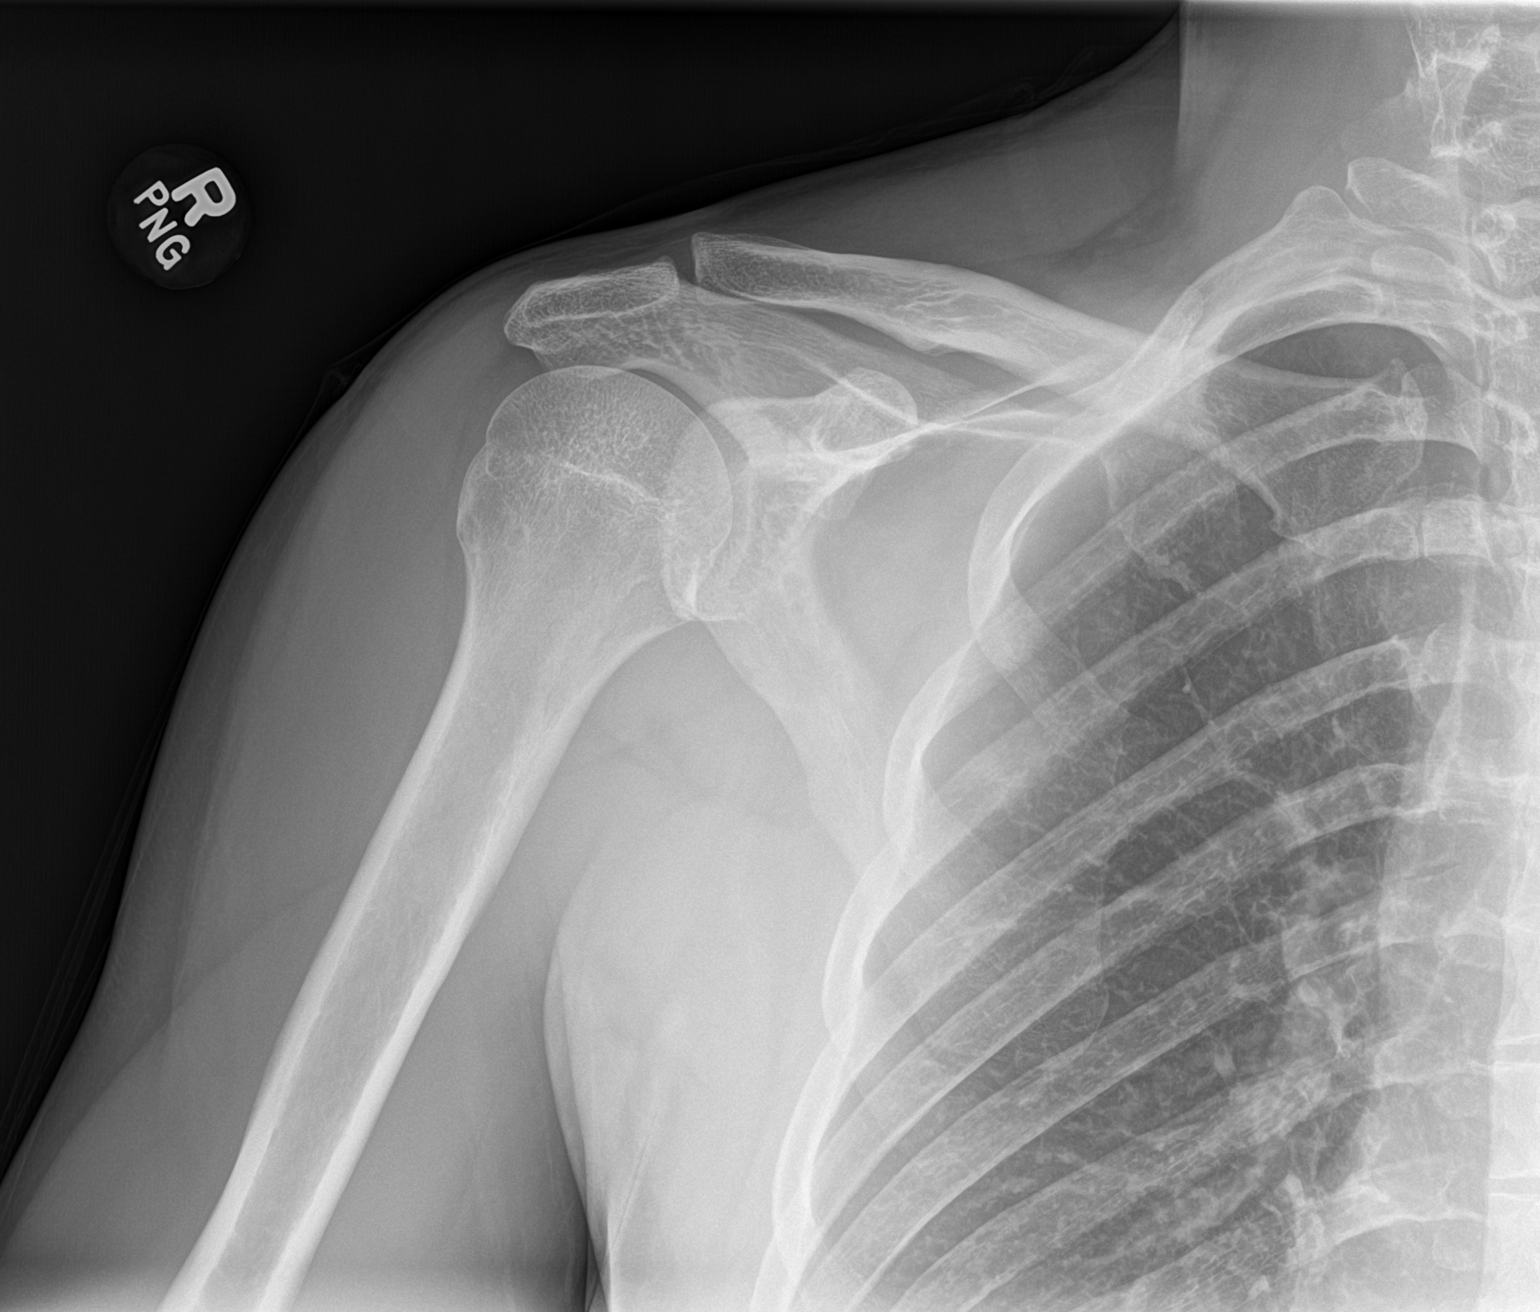

[shoulder ap (2 of 2)]
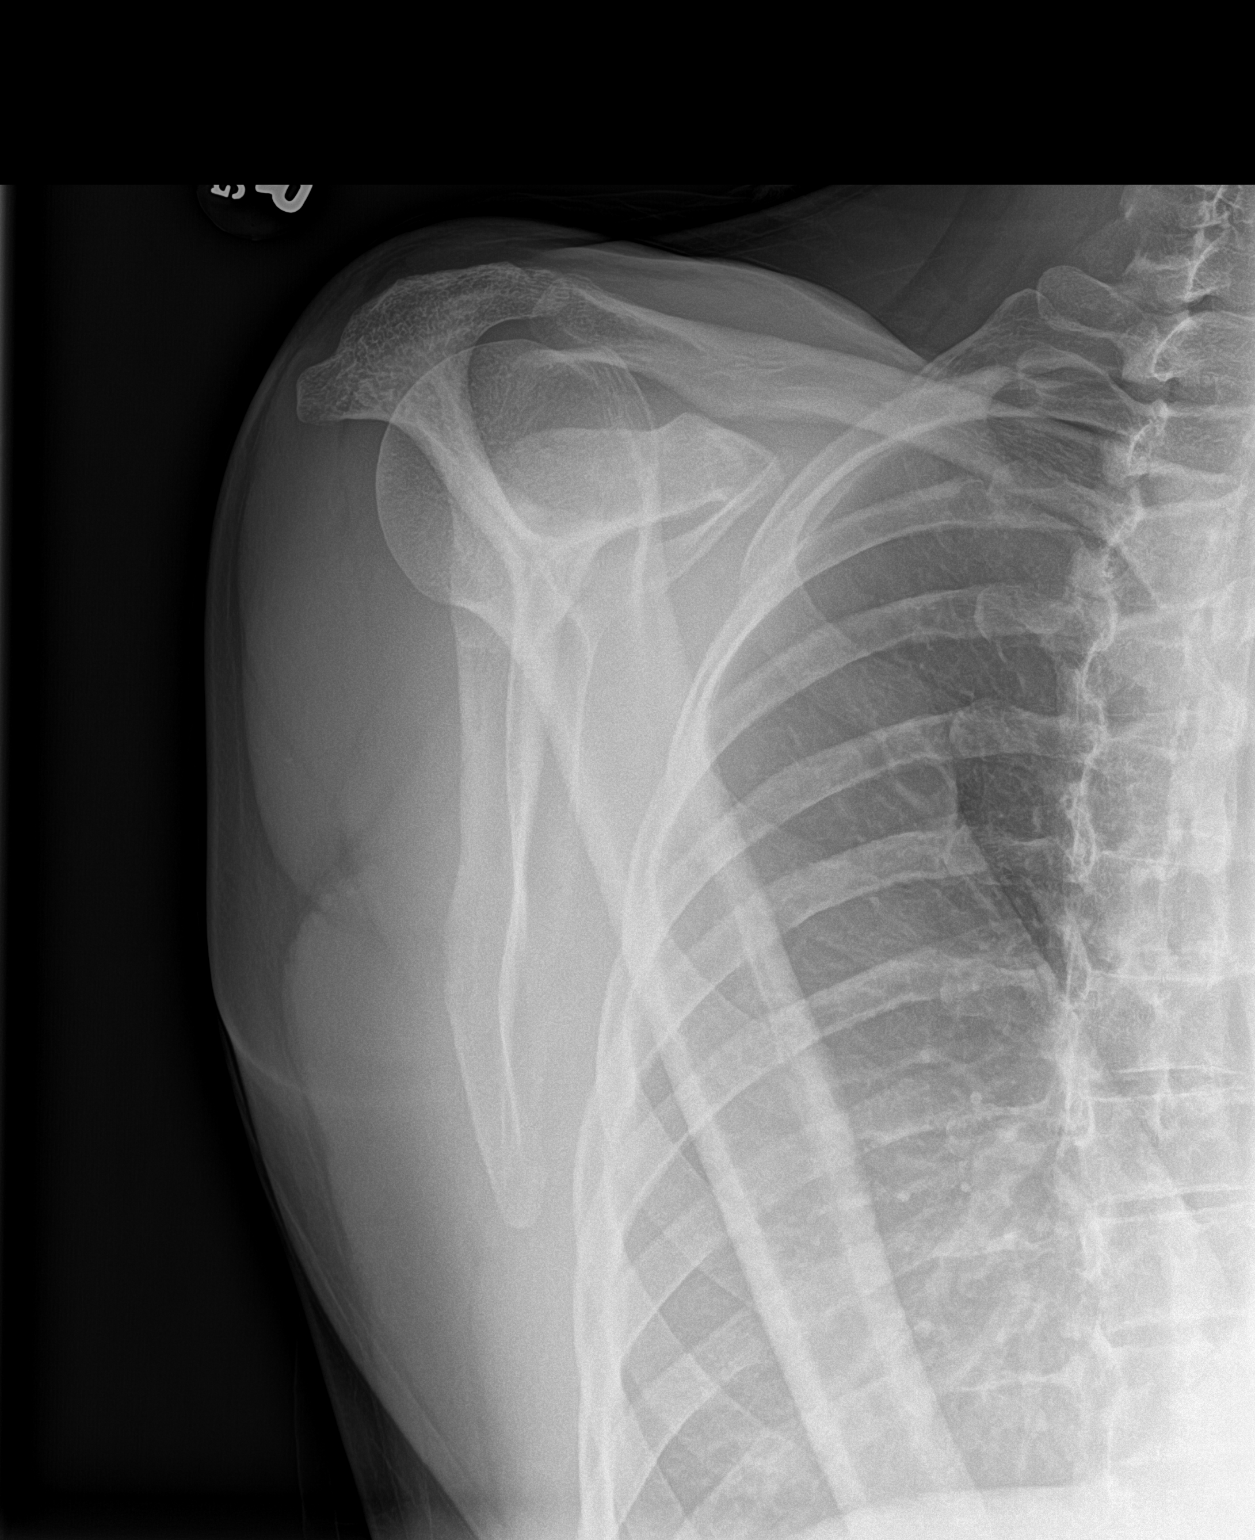

[shoulder axial]
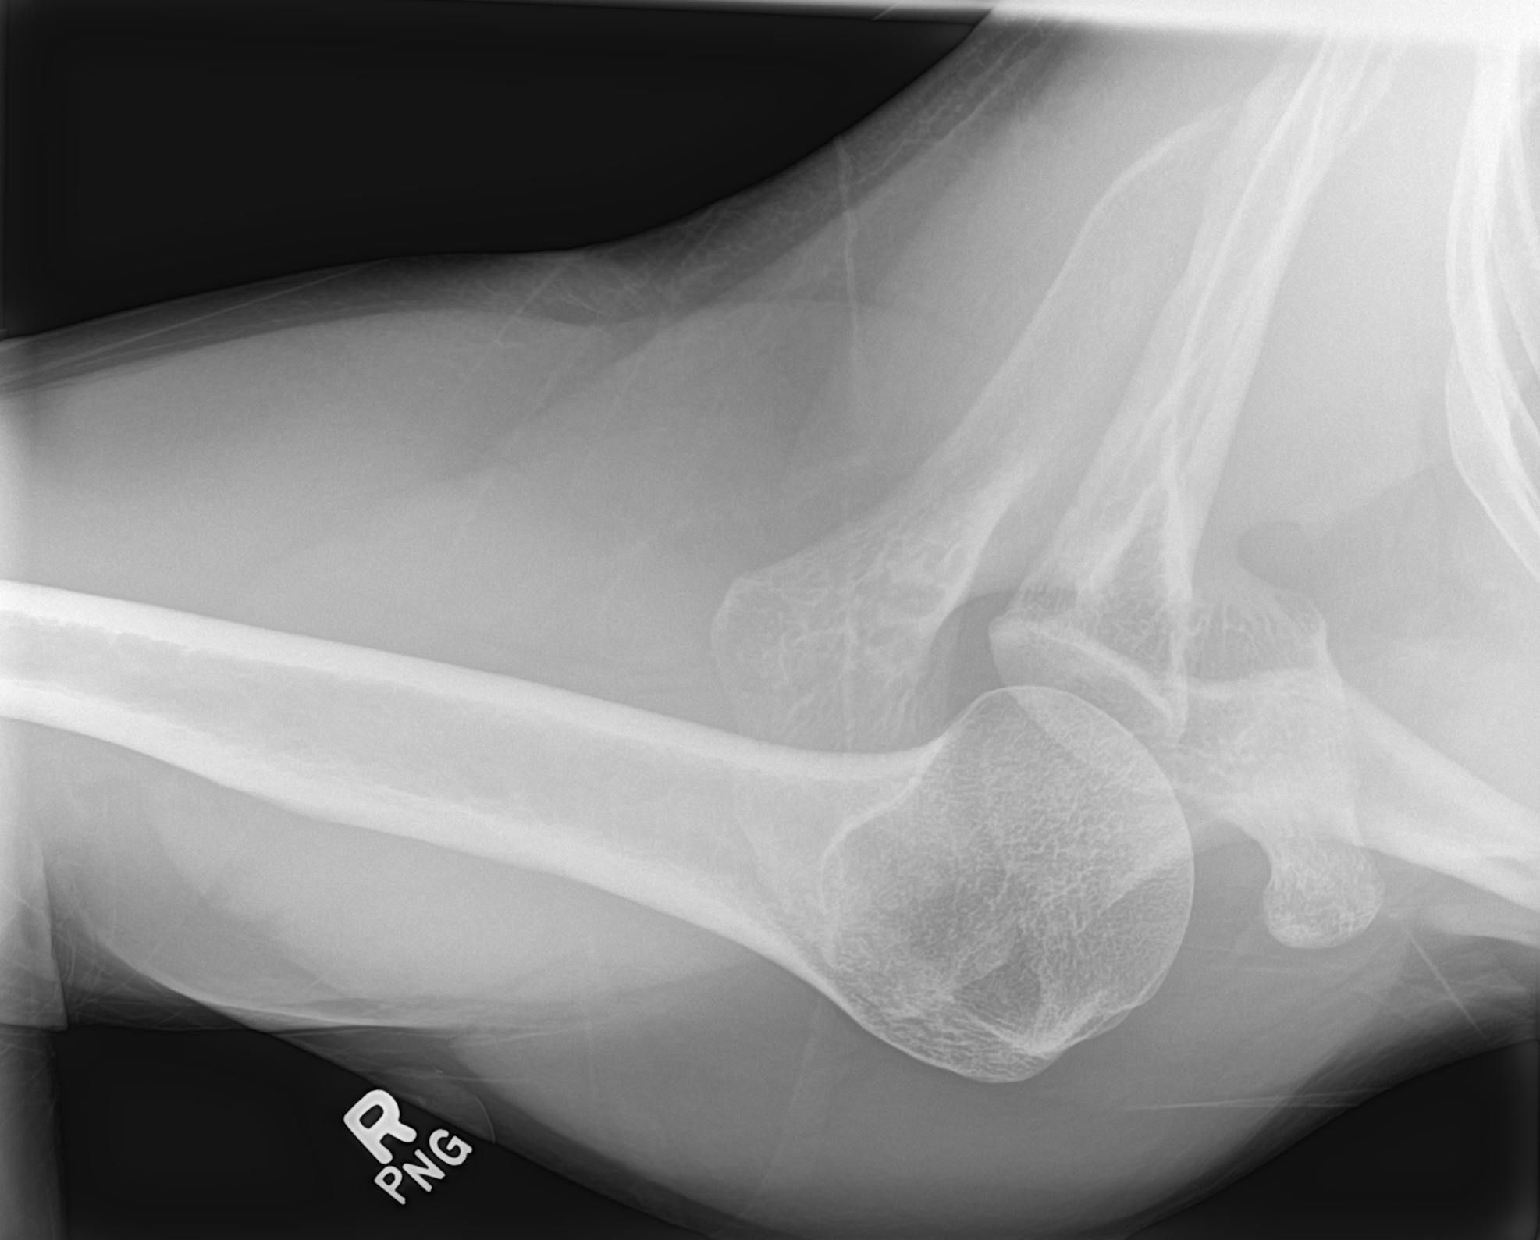

[3 of 3 positions shown; findings below may reference images not displayed]

FINDINGS: Frontal, Y scapular, and axillary images were obtained. There is no
fracture or dislocation. Joint spaces appear unremarkable. No
erosive change or intra-articular calcification. Visualized right
lung clear.
IMPRESSION: No fracture or dislocation.  No appreciable arthropathy.

## 2021-11-23 ENCOUNTER — Ambulatory Visit: Payer: BLUE CROSS/BLUE SHIELD | Admitting: Family Medicine

## 2021-11-23 ENCOUNTER — Other Ambulatory Visit: Payer: Self-pay

## 2021-11-23 ENCOUNTER — Encounter: Payer: Self-pay | Admitting: Family Medicine

## 2021-11-23 VITALS — BP 101/59 | HR 58 | Temp 98.2°F | Ht 76.0 in | Wt 209.0 lb

## 2021-11-23 DIAGNOSIS — L409 Psoriasis, unspecified: Secondary | ICD-10-CM

## 2021-11-23 DIAGNOSIS — B353 Tinea pedis: Secondary | ICD-10-CM | POA: Insufficient documentation

## 2021-11-23 MED ORDER — CICLOPIROX OLAMINE 0.77 % EX CREA: TOPICAL_CREAM | Freq: Two times a day (BID) | CUTANEOUS | 5 refills | Status: DC

## 2021-11-23 MED ORDER — TRIAMCINOLONE ACETONIDE 0.1 % EX CREA: 1.0000 "application " | TOPICAL_CREAM | Freq: Two times a day (BID) | CUTANEOUS | 5 refills | Status: DC

## 2021-11-23 NOTE — Patient Instructions (Addendum)
Psoriasis Psoriasis is a long-term (chronic) skin condition. It occurs because your body's defense system (immune system) causes skin cells to form too quickly. This causes raised, red patches (plaques) on your skin that look silvery. The patches may be on all areas of your body.They can be any size or shape. Psoriasis can come and go. It can range from mild to very bad. It cannot be passed from one person to another (is not contagious). There is no cure for this condition, but it can be helped with treatment. What are the causes? The cause of psoriasis is not known. Some things can make it worse. These are: Skin damage, such as cuts, scrapes, sunburn, and dryness. Not getting enough sunlight. Some medicines. Alcohol. Tobacco. Stress. Infections. What increases the risk? Having a family member with psoriasis. Being very overweight (obese). Being 20-40 years old. Taking certain medicines. What are the signs or symptoms? There are different types of psoriasis. The types are: Plaque. This is the most common. Symptoms include red, raised patches with a silvery coating. These may be itchy. Your nails may be crumbly or fall off. Guttate. Symptoms include small red spots on your stomach area, arms, and legs. These may happen after you have been sick, such as with strep throat. Inverse. Symptoms include patches in your armpits, under your breasts, private areas, or on your butt. Pustular. Symptoms include pus-filled bumps on the palms of your hands or the soles of your feet. You also may feel very tired, weak, have a fever, and not be hungry. Erythrodermic. Symptoms include bright red skin that looks burned. You may have a fast heartbeat and a body temperature that is too high or too low. You may be itchy or in pain. Sebopsoriasis. Symptoms include red patches on your scalp, forehead, and face that are greasy. Psoriatic arthritis. Symptoms include swollen, painful joints along with scaly skin  patches. How is this treated? There is no cure for this condition, but treatment can: Help your skin heal. Lessen itching and irritation and swelling (inflammation). Slow the growth of new skin cells. Help your body's defense system respond better to your skin. Treatment may include: Creams or ointments. Light therapy. This may include natural sunlight or light therapy in a doctor's office. Medicines. These can help your body better manage skin cells. They may be used with light therapy or ointments. Medicines may include pills or injections. You may also get antibiotic medicines if you have an infection. Follow these instructions at home: Skin Care Apply lotion to your skin as needed. Only use those that your doctor has said are okay. Apply cool, wet cloths (cold compresses) to the affected areas. Do not use a hot tub or take hot showers. Use slightly warm, not hot, water when taking showers and baths. Do not scratch your skin. Lifestyle  Do not use any products that contain nicotine or tobacco, such as cigarettes, e-cigarettes, and chewing tobacco. If you need help quitting, ask your doctor. Lower your stress. Keep a healthy weight. Go out in the sun as told by your doctor. Do not get sunburned. Join a support group.  Medicines Take or use over-the-counter and prescription medicines only as told by your doctor. If you were prescribed an antibiotic medicine, take it as told by your doctor. Do not stop using the antibiotic even if you start to feel better. Alcohol use If you drink alcohol: Limit how much you use: 0-1 drink a day for women. 0-2 drinks a day for men.   Be aware of how much alcohol is in your drink. In the [place] one drink equals one 12 oz bottle of beer (355 mL), one 5 oz glass of wine (148 mL), or one 1 oz glass of hard liquor (44 mL). General instructions Keep a journal to track the things that cause symptoms (triggers). Try to avoid these things. See a counselor if  you feel the support would help. Keep all follow-up visits as told by your doctor. This is important. Contact a doctor if: You have a fever. Your pain gets worse. You have more redness or warmth in the affected areas. You have new or worse pain or stiffness in your joints. Your nails start to break easily or pull away from the nail bed. You feel very sad (depressed). Summary Psoriasis is a long-term (chronic) skin condition. There is no cure for this condition, but treatment can help manage it. Keep a journal to track the things that cause symptoms. Take or use over-the-counter and prescription medicines only as told by your doctor. Keep all follow-up visits as told by your doctor. This is important. This information is not intended to replace advice given to you by your health care provider. Make sure you discuss any questions you have with your healthcare provider. Document Revised: 07/02/2018 Document Reviewed: 07/02/2018 Elsevier Patient Education  2022 Elsevier Inc.  

## 2021-11-23 NOTE — Progress Notes (Signed)
? ? ? ?This visit occurred during the SARS-CoV-2 public health emergency.  Safety protocols were in place, including screening questions prior to the visit, additional usage of staff PPE, and extensive cleaning of exam room while observing appropriate contact time as indicated for disinfecting solutions.  ? ? ?Paul [name] , 09-04-1986, 36 y.o., male ?MRN: [id number] ?Patient Care Team  ?  Relationship Specialty Notifications Start End  ?Ma Hillock, DO PCP - General Family Medicine  05/04/15   ? ? ?Chief Complaint  ?Patient presents with  ? Rash  ?  Pt c/o rash on outer ear worsen in 4 mos; pt reports rash to itch and skin peeling; pt also mention b/l feet swelling   ? ?  ?Subjective: Pt presents for an OV with complaints of itchy rash by his ears. Its has occurred in the past. He feels it is worse this winter. He has a h/o scalp psoriasis. He occasional ly puts lotion on it  ? ?He also endorses a rash on his bilateral feet that will blister and become itchy. He put otc athletes foot cream on an area and it resolved.  ? ?Depression screen [facility] 2/9 11/23/2021 07/01/2020 05/21/2018 05/08/2016  ?Decreased Interest 0 0 0 0  ?Down, Depressed, Hopeless 0 0 0 0  ?PHQ - 2 Score 0 0 0 0  ? ? ?No Known Allergies ?Social History  ? ?Social History Narrative  ? Originally from  [place]. Married to [place].   ? 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   ? Wears his seatbelt. Wears a bike helmet.   ? Exercises more than 3 times a week.   ? Has a smoke alarm the home.   ? Has firearms in the home.  ? ?Past Medical History:  ?Diagnosis Date  ? Anxiety   ? Arthritis   ? Costocgondritis  ? Back [name]   ? 03/17/12 xray: minimal compression deformity L1. 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  ? Chickenpox   ? childhood   ? Family history of breast cancer   ? Family history of lung cancer   ? Family history of Lynch syndrome   ? Heart murmur   ? present since childhood, normal echo 2002   ? IBS (irritable bowel syndrome) 2017  ? bloody stool x1  ? Insomnia 2017  ? Psoriasis of scalp   ? ?Past Surgical History:  ?Procedure Laterality Date  ? COLONOSCOPY    ? WISDOM TOOTH EXTRACTION    ? ?Family History  ?Problem Relation Age of Onset  ? Alcohol abuse Mother   ? Other Mother   ?     Lynch syndrome- PMS2 nutation   ? Lung cancer Father 41  ?     nonsmoker  ? Other Sister   ?     Lynch syndrome   ? Healthy Brother   ? Breast cancer Maternal Grandmother 25  ? Endometrial cancer Maternal Great-grandmother 60  ? Ovarian cancer Maternal Great-grandmother 4  ? Cancer Other 54  ?     urothelial cancer  ? Colon cancer Neg Hx   ? Prostate cancer Neg Hx   ? Pancreatic cancer Neg Hx   ? Gastric cancer Neg Hx   ? Stomach cancer Neg Hx   ? Esophageal cancer Neg Hx   ? Colon polyps Neg Hx   ? Rectal cancer Neg Hx   ? ?Allergies as of 11/23/2021   ?No Known Allergies ?  ? ?  ?  Medication List  ?  ? ?  ? Accurate as of November 23, 2021  3:35 PM. If you have any questions, ask your nurse or doctor.  ?  ?  ? ?  ? ?ciclopirox 0.77 % cream ?Commonly known as: LOPROX ?Apply topically 2 (two) times daily. Apply to feet twice a day for at least 4 weeks ?Started by: Howard Pouch, DO ?  ?DULoxetine 60 MG capsule ?Commonly known as: Cymbalta ?Take 1 capsule (60 mg total) by mouth daily. ?  ?multivitamin tablet ?Take 1 tablet by mouth daily. ?  ?triamcinolone cream 0.1 % ?Commonly known as: KENALOG ?Apply 1 application. topically 2 (two) times daily. ?Started by: Howard Pouch, DO ?  ? ?  ? ? ?All past medical history, surgical history, allergies, family history, immunizations andmedications were updated in the EMR today and reviewed under the history and medication portions of their EMR.    ? ?ROS ?Negative, with the exception of above mentioned in HPI ? ? ?Objective:  ?BP (!) 101/59   Pulse (!) 58   Temp 98.2 ?F (36.8 ?C) (Oral)   Ht '6\' 4"'  (1.93 m)   Wt 209 lb (94.8 kg)   SpO2 98%   BMI 25.44 kg/m?  ?Body mass index is 25.44  kg/m?Marland Kitchen ?Physical Exam ?Vitals and nursing note reviewed.  ?Constitutional:   ?   General: He is not in acute distress. ?   Appearance: Normal appearance. He is not ill-appearing, toxic-appearing or diaphoretic.  ?HENT:  ?   Head: Normocephalic and atraumatic.  ?Eyes:  ?   General: No scleral icterus.    ?   Right eye: No discharge.     ?   Left eye: No discharge.  ?   Extraocular Movements: Extraocular movements intact.  ?   Pupils: Pupils are equal, round, and reactive to light.  ?Skin: ?   General: Skin is warm and dry.  ?   Coloration: Skin is not jaundiced or pale.  ?   Findings: Rash (dry scaly area bilateral ears.) present.  ?   Comments: Bilateral feet with small blisters on arches.   ?Neurological:  ?   Mental Status: He is alert and oriented to person, place, and time. Mental status is at baseline.  ?Psychiatric:     ?   Mood and Affect: Mood normal.     ?   Behavior: Behavior normal.     ?   Thought Content: Thought content normal.     ?   Judgment: Judgment normal.  ? ? ? ?No results found. ?No results found. ?No results found for this or any previous visit (from the past 24 hour(s)). ? ?Assessment/Plan: ?Paul [name] is a 36 y.o. male present for OV for  ?Psoriasis ?Kenalog cream and Cetaphil recommended.  ? ?Tinea pedis of both feet ?Encouraged new towel/wash cloth use ea time.  ? ? ?Reviewed expectations re: course of current medical issues. ?Discussed self-management of symptoms. ?Outlined signs and symptoms indicating need for more acute intervention. ?Patient verbalized understanding and all questions were answered. ?Patient received an After-Visit Summary. ? ? ?No orders of the defined types were placed in this encounter. ? ?Meds ordered this encounter  ?Medications  ? triamcinolone cream (KENALOG) 0.1 %  ?  Sig: Apply 1 application. topically 2 (two) times daily.  ?  Dispense:  45 g  ?  Refill:  5  ? ciclopirox (LOPROX) 0.77 % cream  ?  Sig: Apply topically 2 (two) times daily. Apply to feet  twice a day for at least 4 weeks  ?  Dispense:  30 g  ?  Refill:  5  ? ?Referral Orders  ?No referral(s) requested today  ? ? ? ?Note is dictated utilizing voice recognition software. Although note has been proof read prior to signing, occasional typographical errors still can be missed. If any questions arise, please do not hesitate to call for verification.  ? ?electronically signed by: ? ?Howard Pouch, DO  ? [facility] - [place] ? ? ? ?

## 2022-03-22 ENCOUNTER — Encounter: Payer: Self-pay | Admitting: Family

## 2022-03-22 ENCOUNTER — Ambulatory Visit: Payer: BLUE CROSS/BLUE SHIELD | Admitting: Family

## 2022-03-22 VITALS — BP 100/62 | HR 82 | Temp 98.0°F | Ht 76.0 in | Wt 200.5 lb

## 2022-03-22 DIAGNOSIS — R197 Diarrhea, unspecified: Secondary | ICD-10-CM

## 2022-03-22 MED ORDER — DIPHENOXYLATE-ATROPINE 2.5-0.025 MG PO TABS: 1.0000 | ORAL_TABLET | Freq: Four times a day (QID) | ORAL | 0 refills | Status: DC | PRN

## 2022-03-22 NOTE — Progress Notes (Signed)
Patient ID: Paul [name], male    DOB: Jul 17, 1986, 36 y.o.   MRN: [id number]  Chief Complaint  Patient presents with   GI Problem    Pt c/o abdominal pain and diarrhea since 7/9. Has tried gas-x and ( kayopectate  which did not help much. Pt states it started out as pain then gas to diarrhea. Going to the bathroom atleast every hour.     HPI: Abd pain:  with diarrhea for 3 days. Stools are watery and having almost every hour, denies nausea or vomiting, states the pain is less often or as intense as when first started. Reports travelling to [place] recently, but back for 5 days before sx started, wife also travelled and she has not had any symptoms.   Assessment & Plan:  1. Diarrhea, unspecified type Sending Lomotil, advised on use & SE, eating BRAT diet, importance of hydration, 2L water qd. Call office if not improving, may need stool testing.  - diphenoxylate-atropine (LOMOTIL) 2.5-0.025 MG tablet; Take 1-2 tablets by mouth 4 (four) times daily as needed for diarrhea or loose stools (Max dose of 8 pills in 24 hours).  Dispense: 30 tablet; Refill: 0  Subjective:    Outpatient Medications Prior to Visit  Medication Sig Dispense Refill   ciclopirox (LOPROX) 0.77 % cream Apply topically 2 (two) times daily. Apply to feet twice a day for at least 4 weeks 30 g 5   DULoxetine (CYMBALTA) 60 MG capsule Take 1 capsule (60 mg total) by mouth daily. 90 capsule 3   triamcinolone cream (KENALOG) 0.1 % Apply 1 application. topically 2 (two) times daily. 45 g 5   Multiple Vitamin (MULTIVITAMIN) tablet Take 1 tablet by mouth daily.     No facility-administered medications prior to visit.   Past Medical History:  Diagnosis Date   Anxiety    Arthritis    Costocgondritis   Back pain    03/17/12 xray: minimal compression deformity L1. 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   Chickenpox    childhood    Family history of breast cancer    Family history of lung cancer     Family history of Lynch syndrome    Heart murmur    present since childhood, normal echo 2002   IBS (irritable bowel syndrome) 2017   bloody stool x1   Insomnia 2017   Psoriasis of scalp    Past Surgical History:  Procedure Laterality Date   COLONOSCOPY     WISDOM TOOTH EXTRACTION     No Known Allergies    Objective:    Physical Exam Vitals and nursing note reviewed.  Constitutional:      General: He is not in acute distress.    Appearance: Normal appearance.  HENT:     Head: Normocephalic.  Cardiovascular:     Rate and Rhythm: Normal rate and regular rhythm.  Pulmonary:     Effort: Pulmonary effort is normal.     Breath sounds: Normal breath sounds.  Musculoskeletal:        General: Normal range of motion.     Cervical back: Normal range of motion.  Skin:    General: Skin is warm and dry.  Neurological:     Mental Status: He is alert and oriented to person, place, and time.  Psychiatric:        Mood and Affect: Mood normal.    BP 100/62 (BP Location: Left Arm, Patient Position: Sitting, Cuff Size: Large)   Pulse 82  Temp 98 F (36.7 C) (Temporal)   Ht 6\' 4"  (1.93 m)   Wt 200 lb 8 oz (90.9 kg)   SpO2 98%   BMI 24.41 kg/m  Wt Readings from Last 3 Encounters:  03/22/22 200 lb 8 oz (90.9 kg)  11/23/21 209 lb (94.8 kg)  07/14/21 210 lb (95.3 kg)       13/03/22, NP

## 2022-04-24 ENCOUNTER — Other Ambulatory Visit: Payer: Self-pay | Admitting: Family Medicine

## 2022-04-25 NOTE — Telephone Encounter (Signed)
Rx refill request approved per Dr. Corey's orders. 

## 2022-05-17 ENCOUNTER — Other Ambulatory Visit: Payer: Self-pay | Admitting: Family Medicine

## 2022-05-17 NOTE — Telephone Encounter (Signed)
Rx refill request approved per Dr. Corey's orders. 

## 2022-08-22 ENCOUNTER — Ambulatory Visit: Payer: BLUE CROSS/BLUE SHIELD | Admitting: Family Medicine

## 2022-08-22 ENCOUNTER — Encounter: Payer: Self-pay | Admitting: Family Medicine

## 2022-08-22 VITALS — BP 110/65 | HR 86 | Temp 98.5°F | Ht 76.0 in | Wt 204.0 lb

## 2022-08-22 DIAGNOSIS — J029 Acute pharyngitis, unspecified: Secondary | ICD-10-CM

## 2022-08-22 DIAGNOSIS — H6690 Otitis media, unspecified, unspecified ear: Secondary | ICD-10-CM

## 2022-08-22 LAB — POCT INFLUENZA A/B
Influenza A, POC: NEGATIVE
Influenza B, POC: NEGATIVE

## 2022-08-22 LAB — POCT RAPID STREP A (OFFICE): Rapid Strep A Screen: NEGATIVE

## 2022-08-22 LAB — POC COVID19 BINAXNOW: SARS Coronavirus 2 Ag: NEGATIVE

## 2022-08-22 MED ORDER — AMOXICILLIN-POT CLAVULANATE 875-125 MG PO TABS: 1.0000 | ORAL_TABLET | Freq: Two times a day (BID) | ORAL | 0 refills | Status: DC

## 2022-08-22 NOTE — Progress Notes (Unsigned)
Paul [name] , 28-Sep-1985, 36 y.o., male MRN: [id number] Patient Care Team    Relationship Specialty Notifications Start End  Paul Hillock, DO PCP - General Family Medicine  05/04/15     Chief Complaint  Patient presents with   Ear Pain    Pt c/o R ear pain and sore throat x 1 day;      Subjective: Pt presents for an OV with complaints of sore throat and right ear pain of 2 days duration.  Associated symptoms include sore throat, mild headache.  Pt has tried nothing to ease their symptoms. His daughter had been ill, but not currently.     08/22/2022    4:08 PM 11/23/2021    3:08 PM 07/01/2020    8:20 AM 05/21/2018   10:35 AM 05/08/2016   11:22 AM  Depression screen PHQ 2/9  Decreased Interest 0 0 0 0 0  Down, Depressed, Hopeless 0 0 0 0 0  PHQ - 2 Score 0 0 0 0 0    No Known Allergies Social History   Social History Narrative   Originally from  [place]. Married to Paul [name].    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    Wears his seatbelt. Wears a bike helmet.    Exercises more than 3 times a week.    Has a smoke alarm the home.    Has firearms in the home.   Past Medical History:  Diagnosis Date   Anxiety    Arthritis    Costocgondritis   Back pain    03/17/12 xray: minimal compression deformity L1. 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   Chickenpox    childhood    Family history of breast cancer    Family history of lung cancer    Family history of Lynch syndrome    Heart murmur    present since childhood, normal echo 2002   IBS (irritable bowel syndrome) 2017   bloody stool x1   Insomnia 2017   Psoriasis of scalp    Past Surgical History:  Procedure Laterality Date   COLONOSCOPY     WISDOM TOOTH EXTRACTION     Family History  Problem Relation Age of Onset   Alcohol abuse Mother    Other Mother        Lynch syndrome- PMS2 nutation    Lung cancer Father 77       nonsmoker   Other  Sister        Lynch syndrome    Healthy Brother    Breast cancer Maternal Grandmother 46   Endometrial cancer Maternal Great-grandmother 78   Ovarian cancer Maternal Great-grandmother 57   Cancer Other 54       urothelial cancer   Colon cancer Neg Hx    Prostate cancer Neg Hx    Pancreatic cancer Neg Hx    Gastric cancer Neg Hx    Stomach cancer Neg Hx    Esophageal cancer Neg Hx    Colon polyps Neg Hx    Rectal cancer Neg Hx    Allergies as of 08/22/2022   No Known Allergies      Medication List        Accurate as of August 22, 2022  4:38 PM. If you have any questions, ask your nurse or doctor.          STOP taking these medications    ciclopirox 0.77 % cream  Commonly known as: LOPROX Stopped by: Paul Pouch, DO   triamcinolone cream 0.1 % Commonly known as: KENALOG Stopped by: Paul Pouch, DO       TAKE these medications    amoxicillin-clavulanate 875-125 MG tablet Commonly known as: AUGMENTIN Take 1 tablet by mouth 2 (two) times daily. Started by: Paul Pouch, DO   diphenoxylate-atropine 2.5-0.025 MG tablet Commonly known as: Lomotil Take 1-2 tablets by mouth 4 (four) times daily as needed for diarrhea or loose stools (Max dose of 8 pills in 24 hours).   DULoxetine 60 MG capsule Commonly known as: CYMBALTA TAKE 1 CAPSULE BY MOUTH EVERY DAY        All past medical history, surgical history, allergies, family history, immunizations andmedications were updated in the EMR today and reviewed under the history and medication portions of their EMR.     Review of Systems  Constitutional:  Negative for chills, fever and malaise/fatigue.  HENT:  Positive for congestion, ear pain and sore throat. Negative for ear discharge and sinus pain.   Respiratory:  Negative for cough, sputum production, shortness of breath and wheezing.   Gastrointestinal:  Negative for abdominal pain, diarrhea, nausea and vomiting.  Musculoskeletal:  Negative for myalgias.   Neurological:  Positive for headaches. Negative for dizziness.   Negative, with the exception of above mentioned in HPI   Objective:  BP 110/65   Pulse 86   Temp 98.5 F (36.9 C) (Oral)   Ht _0  (1.93 m)   Wt 204 lb (92.5 kg)   SpO2 98%   BMI 24.83 kg/m  Body mass index is 24.83 kg/m. Physical Exam Vitals and nursing note reviewed.  Constitutional:      General: He is not in acute distress.    Appearance: Normal appearance. He is not ill-appearing, toxic-appearing or diaphoretic.  HENT:     Head: Normocephalic and atraumatic.     Right Ear: Ear canal normal. There is no impacted cerumen.     Left Ear: Tympanic membrane and ear canal normal. There is no impacted cerumen.     Nose: Rhinorrhea present. No congestion.     Mouth/Throat:     Mouth: Mucous membranes are moist.     Pharynx: No oropharyngeal exudate or posterior oropharyngeal erythema.     Comments: PND present. No mouth lesions Eyes:     General: No scleral icterus.       Right eye: No discharge.        Left eye: No discharge.     Extraocular Movements: Extraocular movements intact.     Pupils: Pupils are equal, round, and reactive to light.  Cardiovascular:     Rate and Rhythm: Normal rate and regular rhythm.     Heart sounds: No murmur heard. Pulmonary:     Effort: Pulmonary effort is normal. No respiratory distress.     Breath sounds: Normal breath sounds. No wheezing, rhonchi or rales.  Musculoskeletal:     Cervical back: Neck supple. No tenderness.  Lymphadenopathy:     Cervical: No cervical adenopathy.  Skin:    General: Skin is warm and dry.     Coloration: Skin is not jaundiced or pale.     Findings: No rash.  Neurological:     Mental Status: He is alert and oriented to person, place, and time. Mental status is at baseline.  Psychiatric:        Mood and Affect: Mood normal.        Behavior: Behavior normal.  Thought Content: Thought content normal.        Judgment: Judgment normal.     No results found. No results found. Results for orders placed or performed in visit on 08/22/22 (from the past 24 hour(s))  POCT rapid strep A     Status: Normal   Collection Time: 08/22/22  4:20 PM  Result Value Ref Range   Rapid Strep A Screen Negative Negative    Assessment/Plan: Obdulio Mash is a 36 y.o. male present for OV for  Sore throat/Acute otitis media, unspecified otitis media type - negative POCT testing - POC COVID-19 BinaxNow - POCT rapid strep A - POCT Influenza A/B Rest, hydrate.  Add flonase, mucinex (DM if cough), nettie pot or nasal saline.  Augmentin BID prescribed, take until completed.  F/U 2 weeks of not improved.   Reviewed expectations re: course of current medical issues. Discussed self-management of symptoms. Outlined signs and symptoms indicating need for more acute intervention. Patient verbalized understanding and all questions were answered. Patient received an After-Visit Summary.    Orders Placed This Encounter  Procedures   POC COVID-19 BinaxNow   POCT rapid strep A   POCT Influenza A/B   Meds ordered this encounter  Medications   amoxicillin-clavulanate (AUGMENTIN) 875-125 MG tablet    Sig: Take 1 tablet by mouth 2 (two) times daily.    Dispense:  20 tablet    Refill:  0   Referral Orders  No referral(s) requested today     Note is dictated utilizing voice recognition software. Although note has been proof read prior to signing, occasional typographical errors still can be missed. If any questions arise, please do not hesitate to call for verification.   electronically signed by:  Paul Pouch, DO  [place]

## 2022-08-22 NOTE — Patient Instructions (Addendum)
Return in about 2 weeks (around 09/05/2022), or if symptoms worsen or fail to improve.        Great to see you today.  I have refilled the medication(s) we provide.   If labs were collected, we will inform you of lab results once received either by echart message or telephone call.   - echart message- for normal results that have been seen by the patient already.   - telephone call: abnormal results or if patient has not viewed results in their echart.

## 2023-04-24 ENCOUNTER — Telehealth: Payer: Self-pay | Admitting: Gastroenterology

## 2023-04-24 NOTE — Telephone Encounter (Signed)
Thank you for the note, and I am glad he checked in with [place].  Yes, he is due for both an EGD and colonoscopy for his Lynch syndrome, and they can be scheduled for the same session in the [facility] by the end of this calendar year.  HD

## 2023-04-24 NOTE — Telephone Encounter (Signed)
[place], please see Dr. Myrtie Neither' note below. Thank you!

## 2023-04-24 NOTE — Telephone Encounter (Signed)
Inbound call from patient requesting to know if he is needing his endoscopy this year. Patient stated he was due last year but felt as though he did not need one. Patient is also requesting know if he does need one can it be scheduled at the same time of his colonoscopy that he is due for in November of 2024. Please advise on scheduling. Thank you.

## 2023-04-24 NOTE — Telephone Encounter (Signed)
Pt overdue for recall EGD (lynch syndrome), Ok for patient to be scheduled at the time of recall colonoscopy in 07/2023?

## 2023-04-25 ENCOUNTER — Encounter: Payer: Self-pay | Admitting: Gastroenterology

## 2023-05-02 ENCOUNTER — Encounter: Payer: Self-pay | Admitting: Family Medicine

## 2023-05-02 NOTE — Telephone Encounter (Signed)
Pt is scheduled °

## 2023-05-02 NOTE — Telephone Encounter (Signed)
Have reviewed his chart, it does not appear he has had a physical in quite some time. I would recommend we start with a physical first, and then move forward if any additional testing is needed after further discussion.

## 2023-05-07 ENCOUNTER — Encounter: Payer: BLUE CROSS/BLUE SHIELD | Admitting: Family Medicine

## 2023-05-13 ENCOUNTER — Other Ambulatory Visit: Payer: Self-pay | Admitting: Family Medicine

## 2023-05-16 ENCOUNTER — Ambulatory Visit (INDEPENDENT_AMBULATORY_CARE_PROVIDER_SITE_OTHER): Payer: BLUE CROSS/BLUE SHIELD | Admitting: Family Medicine

## 2023-05-16 ENCOUNTER — Encounter: Payer: Self-pay | Admitting: Family Medicine

## 2023-05-16 VITALS — BP 122/64 | HR 66 | Temp 98.1°F | Ht 76.0 in | Wt 214.4 lb

## 2023-05-16 DIAGNOSIS — R21 Rash and other nonspecific skin eruption: Secondary | ICD-10-CM

## 2023-05-16 MED ORDER — PREDNISONE 10 MG PO TABS: ORAL_TABLET | ORAL | 0 refills | Status: AC

## 2023-05-16 NOTE — Patient Instructions (Addendum)
-  Recommend to cleanse the rash with Dial Antibacterial Soap twice a day, pat dry, and you may apply a thin layer of Triamcinolone cream to the rash. Recommend to avoid heat to the area, it can make the itching and rash worse.  -Recommend to use cool compresses to the area, 20 minutes at a time, 4-6 times a day as needed to help relieve the itching and inflammation.  -Recommend to not use the same razor or beard trimmer for your face and abd.  -START Prednisone 10mg  tablet, 6  day taper.  -Follow up if not improved.

## 2023-05-16 NOTE — Progress Notes (Signed)
Acute Office Visit   Subjective:  Patient ID: Paul [name], male    DOB: Jan 13, 1986, 37 y.o.   MRN: [id number]  Chief Complaint  Patient presents with   Rash    Pt is here today for Rash on Beard and abdominal area left side 2-3 days OTC lotrimin does not help    HPI: Patient is a 37 year old male that presents with a rash on his beard and abd. He has used the same razor, beard trimmer, on his beard and abd last Friday. This rash started a couple days ago. Itching. He reports if he scratches, it will flare up.   He reports he has tried Lotrimin that has not helped.   Review of Systems  Skin:  Positive for rash.  See HPI above     Objective:   BP 122/64   Pulse 66   Temp 98.1 F (36.7 C)   Ht 6\' 4"  (1.93 m)   Wt 214 lb 6 oz (97.2 kg)   SpO2 99%   BMI 26.09 kg/m    Physical Exam Vitals reviewed.  Constitutional:      General: He is not in acute distress.    Appearance: Normal appearance. He is not ill-appearing, toxic-appearing or diaphoretic.  HENT:     Head: Normocephalic and atraumatic.  Eyes:     General:        Right eye: No discharge.        Left eye: No discharge.     Conjunctiva/sclera: Conjunctivae normal.  Cardiovascular:     Rate and Rhythm: Normal rate.  Pulmonary:     Effort: Pulmonary effort is normal. No respiratory distress.     Breath sounds: No rales.  Musculoskeletal:        General: Normal range of motion.  Skin:    General: Skin is warm and dry.     Findings: Rash (Right side of neck and left lower abd-raised, erythema, non scaly skin; please see pictures included) present.  Neurological:     General: No focal deficit present.     Mental Status: He is alert and oriented to person, place, and time. Mental status is at baseline.  Psychiatric:        Mood and Affect: Mood normal.        Behavior: Behavior normal.        Thought Content: Thought content normal.        Judgment: Judgment normal.          Assessment & Plan:  Rash  and nonspecific skin eruption -     predniSONE; Take 6 tablets (60 mg total) by mouth daily with breakfast for 1 day, THEN 5 tablets (50 mg total) daily with breakfast for 1 day, THEN 4 tablets (40 mg total) daily with breakfast for 1 day, THEN 3 tablets (30 mg total) daily with breakfast for 1 day, THEN 2 tablets (20 mg total) daily with breakfast for 1 day, THEN 1 tablet (10 mg total) daily with breakfast for 1 day.  Dispense: 21 tablet; Refill: 0  -Recommend to cleanse the rash with Dial Antibacterial Soap twice a day, pat dry, and he may apply a thin layer of Triamcinolone cream to the rash. Recommend to avoid heat to the area, it can make the itching and rash worse.  -Recommend to use cool compresses to the area, 20 minutes at a time, 4-6 times a day as needed to help relieve the itching and inflammation.  -Recommend to not  use the same razor or beard trimmer for his face and abd.  -Prescribed Prednisone 10mg  tablet, 6  day taper for rash.  -Follow up if not improved.   Zandra Abts, NP

## 2023-05-25 ENCOUNTER — Encounter: Payer: Self-pay | Admitting: Family Medicine

## 2023-05-25 ENCOUNTER — Ambulatory Visit (INDEPENDENT_AMBULATORY_CARE_PROVIDER_SITE_OTHER): Payer: BLUE CROSS/BLUE SHIELD | Admitting: Family Medicine

## 2023-05-25 VITALS — BP 124/69 | HR 65 | Temp 98.0°F | Ht 76.0 in | Wt 215.2 lb

## 2023-05-25 DIAGNOSIS — R21 Rash and other nonspecific skin eruption: Secondary | ICD-10-CM

## 2023-05-25 DIAGNOSIS — Z13220 Encounter for screening for lipoid disorders: Secondary | ICD-10-CM

## 2023-05-25 DIAGNOSIS — Z131 Encounter for screening for diabetes mellitus: Secondary | ICD-10-CM

## 2023-05-25 DIAGNOSIS — Z23 Encounter for immunization: Secondary | ICD-10-CM | POA: Diagnosis not present

## 2023-05-25 DIAGNOSIS — Z1509 Genetic susceptibility to other malignant neoplasm: Secondary | ICD-10-CM | POA: Diagnosis not present

## 2023-05-25 DIAGNOSIS — Z0000 Encounter for general adult medical examination without abnormal findings: Secondary | ICD-10-CM | POA: Diagnosis not present

## 2023-05-25 LAB — COMPREHENSIVE METABOLIC PANEL
ALT: 18 U/L (ref 0–53)
AST: 16 U/L (ref 0–37)
Albumin: 4.5 g/dL (ref 3.5–5.2)
Alkaline Phosphatase: 43 U/L (ref 39–117)
BUN: 15 mg/dL (ref 6–23)
CO2: 31 meq/L (ref 19–32)
Calcium: 9.9 mg/dL (ref 8.4–10.5)
Chloride: 102 meq/L (ref 96–112)
Creatinine, Ser: 0.99 mg/dL (ref 0.40–1.50)
GFR: 97.99 mL/min (ref >60.00)
Glucose, Bld: 81 mg/dL (ref 70–99)
Potassium: 4.8 meq/L (ref 3.5–5.1)
Sodium: 140 meq/L (ref 135–145)
Total Bilirubin: 0.6 mg/dL (ref 0.2–1.2)
Total Protein: 6.9 g/dL (ref 6.0–8.3)

## 2023-05-25 LAB — CBC WITH DIFFERENTIAL/PLATELET
Basophils Absolute: 0 10*3/uL (ref 0.0–0.1)
Basophils Relative: 0.3 % (ref 0.0–3.0)
Eosinophils Absolute: 0.1 10*3/uL (ref 0.0–0.7)
Eosinophils Relative: 1.7 % (ref 0.0–5.0)
HCT: 41 % (ref 39.0–52.0)
Hemoglobin: 13.4 g/dL (ref 13.0–17.0)
Lymphocytes Relative: 32 % (ref 12.0–46.0)
Lymphs Abs: 1.7 10*3/uL (ref 0.7–4.0)
MCHC: 32.8 g/dL (ref 30.0–36.0)
MCV: 87.5 fl (ref 78.0–100.0)
Monocytes Absolute: 0.5 10*3/uL (ref 0.1–1.0)
Monocytes Relative: 9.7 % (ref 3.0–12.0)
Neutro Abs: 3.1 10*3/uL (ref 1.4–7.7)
Neutrophils Relative %: 56.3 % (ref 43.0–77.0)
Platelets: 341 10*3/uL (ref 150.0–400.0)
RBC: 4.68 Mil/uL (ref 4.22–5.81)
RDW: 13.6 % (ref 11.5–15.5)
WBC: 5.4 10*3/uL (ref 4.0–10.5)

## 2023-05-25 LAB — LIPID PANEL
Cholesterol: 194 mg/dL (ref 0–200)
HDL: 45.9 mg/dL (ref >39.00)
LDL Cholesterol: 125 mg/dL — ABNORMAL HIGH (ref 0–99)
NonHDL: 147.77
Total CHOL/HDL Ratio: 4
Triglycerides: 116 mg/dL (ref 0.0–149.0)
VLDL: 23.2 mg/dL (ref 0.0–40.0)

## 2023-05-25 LAB — PSA: PSA: 0.23 ng/mL (ref 0.10–4.00)

## 2023-05-25 LAB — URINALYSIS, ROUTINE W REFLEX MICROSCOPIC
Bilirubin Urine: NEGATIVE
Hgb urine dipstick: NEGATIVE
Ketones, ur: NEGATIVE
Leukocytes,Ua: NEGATIVE
Nitrite: NEGATIVE
RBC / HPF: NONE SEEN (ref 0–?)
Specific Gravity, Urine: 1.01 (ref 1.000–1.030)
Total Protein, Urine: NEGATIVE
Urine Glucose: NEGATIVE
Urobilinogen, UA: 0.2 (ref 0.0–1.0)
WBC, UA: NONE SEEN (ref 0–?)
pH: 7.5 (ref 5.0–8.0)

## 2023-05-25 LAB — TSH: TSH: 1.93 u[IU]/mL (ref 0.35–5.50)

## 2023-05-25 LAB — HEMOGLOBIN A1C: Hgb A1c MFr Bld: 5.8 % (ref 4.6–6.5)

## 2023-05-25 MED ORDER — FLUOCINONIDE 0.05 % EX OINT: 1.0000 | TOPICAL_OINTMENT | Freq: Two times a day (BID) | CUTANEOUS | 1 refills | Status: AC | PRN

## 2023-05-25 NOTE — Progress Notes (Signed)
Patient ID: Paul [name], male  DOB: 04/02/1986, 37 y.o.   MRN: [id number] Patient Care Team    Relationship Specialty Notifications Start End  Paul Leatherwood, DO PCP - General Family Medicine  05/04/15   Sherrilyn Rist, MD Consulting Physician Gastroenterology  05/25/23   Rodolph Bong, MD Consulting Physician Family Medicine  05/25/23   Pollyann Savoy, MD Consulting Physician Rheumatology  05/25/23     Chief Complaint  Patient presents with   Annual Exam    Subjective:  Paul [name] is a 37 y.o. male present for CPE. All past medical history, surgical history, allergies, family history, immunizations, medications and social history were updated in the electronic medical record today. All recent labs, ED visits and hospitalizations within the last year were reviewed.  Health maintenance:  Colonoscopy: Patient with Lynch syndrome, EGD/colonoscopy recommended every 1 to 2 years last screen 07/14/2021, resulted normal. Completed by Dr. Myrtie Neither.  Immunizations:  tdap UTD 2021, influenza provided today Infectious disease screening: HIV completed, Hep C  PSA: Lynch syndrome, PSA recommended yearly and urinalysis Lab Results  Component Value Date   PSA 0.24 07/01/2020   PSA 0.33 12/16/2018  , pt was counseled on prostate cancer screenings.  Patient has a Dental home. Hospitalizations/ED visits: Viewed     05/25/2023    8:35 AM 05/16/2023   12:44 PM 08/22/2022    4:08 PM 11/23/2021    3:08 PM 07/01/2020    8:20 AM  Depression screen PHQ 2/9  Decreased Interest 0 0 0 0 0  Down, Depressed, Hopeless 0 0 0 0 0  PHQ - 2 Score 0 0 0 0 0  Altered sleeping 0 0     Tired, decreased energy 0 0     Change in appetite 0 0     Feeling bad or failure about yourself  0 0     Trouble concentrating 0 0     Moving slowly or fidgety/restless 0 0     Suicidal thoughts 0 0     PHQ-9 Score 0 0     Difficult doing work/chores Not difficult at all Not difficult at all         05/16/2023    12:44 PM 07/05/2015    8:37 AM  GAD 7 : Generalized Anxiety Score  Nervous, Anxious, on Edge 1 2  Control/stop worrying 1 2  Worry too much - different things 0 2  Trouble relaxing 1 3  Restless 1 3  Easily annoyed or irritable 1 2  Afraid - awful might happen 0 0  Total GAD 7 Score 5 14  Anxiety Difficulty Not difficult at all Somewhat difficult            05/25/2023    8:35 AM 05/16/2023   12:44 PM 05/08/2016   11:22 AM  Fall Risk   Falls in the past year? 0 0 No  Number falls in past yr: 0 0   Injury with Fall? 0 0   Risk for fall due to : No Fall Risks No Fall Risks   Follow up Falls evaluation completed Falls evaluation completed       Immunization History  Administered Date(s) Administered   DTaP 01/14/1987, 03/16/1987, 05/18/1987, 06/02/1988, 02/13/1992   HIB (PRP-OMP) 06/02/1988   Hepatitis B 07/07/1992, 04/28/1997, 09/15/1997   IPV 01/14/1987, 03/16/1987, 06/02/1988, 02/13/1992   Influenza Inj Mdck Quad Pf 06/20/2019   Influenza, Seasonal, Injecte, Preservative Fre 05/25/2023   Influenza,inj,Quad PF,6+ Mos 05/08/2016,  05/21/2018   Influenza-Unspecified 06/17/2020, 06/11/2021   MMR 02/11/1987, 04/29/1999   Meningococcal Conjugate 04/10/2005   Moderna Sars-Covid-2 Vaccination 11/17/2019, 12/16/2019, 08/15/2020   Td 04/12/2015   Tdap 07/01/2020     Past Medical History:  Diagnosis Date   Anxiety    Arthritis    Costocgondritis   Back pain    03/17/12 xray: minimal compression deformity L1. 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   Chickenpox    childhood    Costochondritis 05/21/2018   Family history of breast cancer    Family history of lung cancer    Family history of Lynch syndrome    Heart murmur    present since childhood, normal echo 2002   IBS (irritable bowel syndrome) 2017   bloody stool x1   Insomnia 2017   Psoriasis of scalp    No Known Allergies Past Surgical History:  Procedure Laterality Date    COLONOSCOPY     WISDOM TOOTH EXTRACTION     Family History  Problem Relation Age of Onset   Alcohol abuse Mother    Other Mother        Lynch syndrome- PMS2 nutation    Lung cancer Father 76       nonsmoker   Other Sister        Lynch syndrome    Healthy Brother    Breast cancer Maternal Grandmother 46   Endometrial cancer Maternal Great-grandmother 53   Ovarian cancer Maternal Great-grandmother 64   Cancer Other 54       urothelial cancer   Colon cancer Neg Hx    Prostate cancer Neg Hx    Pancreatic cancer Neg Hx    Gastric cancer Neg Hx    Stomach cancer Neg Hx    Esophageal cancer Neg Hx    Colon polyps Neg Hx    Rectal cancer Neg Hx    Social History   Social History Narrative   Originally from  [place]. Married to [place].    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 1 dog.    Wears his seatbelt. Wears a bike helmet.    Exercises more than 3 times a week.    Has a smoke alarm the home.    Has firearms in the home.    Allergies as of 05/25/2023   No Known Allergies      Medication List        Accurate as of May 25, 2023 10:32 AM. If you have any questions, ask your nurse or doctor.          STOP taking these medications    triamcinolone 0.1 % cream : eucerin Crea Stopped by: Paul [name]       TAKE these medications    ciclopirox 0.77 % cream Commonly known as: LOPROX Apply 77 % topically 2 (two) times daily.   DULoxetine 60 MG capsule Commonly known as: CYMBALTA TAKE 1 CAPSULE BY MOUTH EVERY DAY   fluocinonide ointment 0.05 % Commonly known as: LIDEX Apply 1 Application topically 2 (two) times daily as needed. Started by: Paul [name]       All past medical history, surgical history, allergies, family history, immunizations andmedications were updated in the EMR today and reviewed under the history and medication portions of their EMR.     No results found for this or any previous visit (from the past 2160  hour(s)).   ROS 14 pt review of systems performed and negative (unless  mentioned in an HPI)  Objective: BP 124/69   Pulse 65   Temp 98 F (36.7 C)   Ht 6\' 4"  (1.93 m)   Wt 215 lb 3.2 oz (97.6 kg)   SpO2 97%   BMI 26.19 kg/m  Physical Exam Constitutional:      General: He is not in acute distress.    Appearance: Normal appearance. He is not ill-appearing, toxic-appearing or diaphoretic.  HENT:     Head: Normocephalic and atraumatic.     Right Ear: Tympanic membrane, ear canal and external ear normal. There is no impacted cerumen.     Left Ear: Tympanic membrane, ear canal and external ear normal. There is no impacted cerumen.     Nose: Nose normal. No congestion or rhinorrhea.     Mouth/Throat:     Mouth: Mucous membranes are moist.     Pharynx: Oropharynx is clear. No oropharyngeal exudate or posterior oropharyngeal erythema.  Eyes:     General: No scleral icterus.       Right eye: No discharge.        Left eye: No discharge.     Extraocular Movements: Extraocular movements intact.     Pupils: Pupils are equal, round, and reactive to light.  Cardiovascular:     Rate and Rhythm: Normal rate and regular rhythm.     Pulses: Normal pulses.     Heart sounds: Normal heart sounds. No murmur heard.    No friction rub. No gallop.  Pulmonary:     Effort: Pulmonary effort is normal. No respiratory distress.     Breath sounds: Normal breath sounds. No stridor. No wheezing, rhonchi or rales.  Chest:     Chest wall: No tenderness.  Abdominal:     General: Abdomen is flat. Bowel sounds are normal. There is no distension.     Palpations: Abdomen is soft. There is no mass.     Tenderness: There is no abdominal tenderness. There is no right CVA tenderness, left CVA tenderness, guarding or rebound.     Hernia: No hernia is present.  Musculoskeletal:        General: No swelling or tenderness. Normal range of motion.     Cervical back: Normal range of motion and neck supple.     Right  lower leg: No edema.     Left lower leg: No edema.  Lymphadenopathy:     Cervical: No cervical adenopathy.  Skin:    General: Skin is warm and dry.     Coloration: Skin is not jaundiced.     Findings: Rash (area of x2 small singular red small raised area chest.  Flat pathy red rash left belt line.) present. No bruising or lesion.  Neurological:     General: No focal deficit present.     Mental Status: He is alert and oriented to person, place, and time. Mental status is at baseline.     Cranial Nerves: No cranial nerve deficit.     Sensory: No sensory deficit.     Motor: No weakness.     Coordination: Coordination normal.     Gait: Gait normal.     Deep Tendon Reflexes: Reflexes normal.  Psychiatric:        Mood and Affect: Mood normal.        Behavior: Behavior normal.        Thought Content: Thought content normal.        Judgment: Judgment normal.     No results found.  Assessment/plan:  Akihiro Cogburn is a 37 y.o. male present for CPE PMS2-related Lynch syndrome (HNPCC4) - PSA - Urinalysis, Routine w reflex microscopic - egd/colonoscopy due> he has been in contact with his GI to schedule Influenza vaccine needed Administered today Lipid screening - Lipid panel Diabetes mellitus screening - Hemoglobin A1c RASH: Lidex ointment BID prn prescribed.  Looks similar to heat rash/elastic rx. Pt reports improved a small fraction with prednisone by another provider, but since has started farther back of belt  line. Other area are consistent with insect bite; assuming no other family members are having similar rash do not suspect scabies. no other areas are affected Routine general medical examination at a health care facility - CBC with Differential/Platelet - Comprehensive metabolic panel - TSH Patient was encouraged to exercise greater than 150 minutes a week. Patient was encouraged to choose a diet filled with fresh fruits and vegetables, and lean meats. AVS provided to  patient today for education/recommendation on gender specific health and safety maintenance. Colonoscopy: Patient with Lynch syndrome, EGD/colonoscopy recommended every 1 to 2 years last screen 07/14/2021, resulted normal. Completed by Dr. Myrtie Neither.  Immunizations:  tdap UTD 2021, influenza provided today Infectious disease screening: HIV completed, Hep C  PSA: Lynch syndrome, PSA recommended yearly and urinalysis  Return for cpe (20 min).   Orders Placed This Encounter  Procedures   Flu vaccine trivalent PF, 6mos and older(Flulaval,Afluria,Fluarix,Fluzone)   CBC with Differential/Platelet   Comprehensive metabolic panel   Hemoglobin A1c   TSH   Lipid panel   PSA   Urinalysis, Routine w reflex microscopic   Meds ordered this encounter  Medications   fluocinonide ointment (LIDEX) 0.05 %    Sig: Apply 1 Application topically 2 (two) times daily as needed.    Dispense:  60 g    Refill:  1   Referral Orders  No referral(s) requested today     Note is dictated utilizing voice recognition software. Although note has been proof read prior to signing, occasional typographical errors still can be missed. If any questions arise, please do not hesitate to call for verification.  Electronically signed by: Paul Pacini, DO [facility] [place]

## 2023-05-25 NOTE — Patient Instructions (Addendum)
Return for cpe (20 min).        Great to see you today.  I have refilled the medication(s) we provide.   If labs were collected or images ordered, we will inform you of  results once we have received them and reviewed. We will contact you either by echart message, or telephone call.  Please give ample time to the testing facility, and our office to run,  receive and review results. Please do not call inquiring of results, even if you can see them in your chart. We will contact you as soon as we are able. If it has been over 1 week since the test was completed, and you have not yet heard from [place], then please call us.    - echart message- for normal results that have been seen by the patient already.   - telephone call: abnormal results or if patient has not viewed results in their echart.  If a referral to a specialist was entered for you, please call us in 2 weeks if you have not heard from the specialist office to schedule.

## 2023-05-29 ENCOUNTER — Encounter: Payer: Self-pay | Admitting: Family Medicine

## 2023-07-02 ENCOUNTER — Ambulatory Visit (AMBULATORY_SURGERY_CENTER): Payer: BLUE CROSS/BLUE SHIELD

## 2023-07-02 ENCOUNTER — Telehealth: Payer: Self-pay | Admitting: Gastroenterology

## 2023-07-02 VITALS — Ht 76.0 in | Wt 210.0 lb

## 2023-07-02 DIAGNOSIS — Z1509 Genetic susceptibility to other malignant neoplasm: Secondary | ICD-10-CM

## 2023-07-02 MED ORDER — NA SULFATE-K SULFATE-MG SULF 17.5-3.13-1.6 GM/177ML PO SOLN
1.0000 | Freq: Once | ORAL | 0 refills | Status: AC
Start: 2023-07-02 — End: 2023-07-02

## 2023-07-02 NOTE — Progress Notes (Signed)

## 2023-07-02 NOTE — Telephone Encounter (Signed)
Pt informed copy of new dates and time for instructions was sent through my chart. No questions at this time.

## 2023-07-02 NOTE — Telephone Encounter (Signed)
Inbound call from patient to reschedule colonoscopy for 11/13. Patient requesting for update paperwork and times to be sent. Please advise, thank you.

## 2023-07-17 ENCOUNTER — Encounter: Payer: Self-pay | Admitting: Gastroenterology

## 2023-07-23 ENCOUNTER — Encounter: Payer: BLUE CROSS/BLUE SHIELD | Admitting: Gastroenterology

## 2023-07-25 ENCOUNTER — Encounter: Payer: Self-pay | Admitting: Gastroenterology

## 2023-07-25 ENCOUNTER — Ambulatory Visit (AMBULATORY_SURGERY_CENTER): Payer: BLUE CROSS/BLUE SHIELD | Admitting: Gastroenterology

## 2023-07-25 VITALS — BP 108/70 | HR 66 | Temp 98.2°F | Resp 18 | Ht 76.0 in | Wt 210.0 lb

## 2023-07-25 DIAGNOSIS — Z1509 Genetic susceptibility to other malignant neoplasm: Secondary | ICD-10-CM | POA: Diagnosis present

## 2023-07-25 MED ORDER — SODIUM CHLORIDE 0.9 % IV SOLN: 500.0000 mL | INTRAVENOUS | Status: DC

## 2023-07-25 NOTE — Op Note (Signed)
[facility] Patient Name: Paul [name] Procedure Date: 07/25/2023 1:37 PM MRN: [id number] Endoscopist: Sherilyn Cooter L. Myrtie Neither , MD, [id number] Age: 37 Referring MD:  Date of Birth: 1985/11/16 Gender: Male Account #: [id number] Procedure:                Upper GI endoscopy Indications:              Hereditary nonpolyposis colorectal cancer (Lynch                            Syndrome)                           no findings August 2020 Medicines:                Monitored Anesthesia Care Procedure:                Pre-Anesthesia Assessment:                           - Prior to the procedure, a History and Physical                            was performed, and patient medications and                            allergies were reviewed. The patient's tolerance of                            previous anesthesia was also reviewed. The risks                            and benefits of the procedure and the sedation                            options and risks were discussed with the patient.                            All questions were answered, and informed consent                            was obtained. Prior Anticoagulants: The patient has                            taken no anticoagulant or antiplatelet agents. ASA                            Grade Assessment: II - A patient with mild systemic                            disease. After reviewing the risks and benefits,                            the patient was deemed in satisfactory condition to  undergo the procedure.                           - Prior to the procedure, a History and Physical                            was performed, and patient medications and                            allergies were reviewed. The patient's tolerance of                            previous anesthesia was also reviewed. The risks                            and benefits of the procedure and the sedation                             options and risks were discussed with the patient.                            All questions were answered, and informed consent                            was obtained. Prior Anticoagulants: The patient has                            taken no anticoagulant or antiplatelet agents. ASA                            Grade Assessment: II - A patient with mild systemic                            disease. After reviewing the risks and benefits,                            the patient was deemed in satisfactory condition to                            undergo the procedure.                           After obtaining informed consent, the endoscope was                            passed under direct vision. Throughout the                            procedure, the patient's blood pressure, pulse, and                            oxygen saturations were monitored continuously. The  Olympus Scope [id number] was introduced through the                            mouth, and advanced to the second part of duodenum.                            The upper GI endoscopy was accomplished without                            difficulty. The patient tolerated the procedure                            well. Scope In: Scope Out: Findings:                 The larynx was normal.                           A 1 cm hiatal hernia was present.                           The exam of the esophagus was otherwise normal.                           The stomach was normal. (examined under WL and NBI)                           The cardia and gastric fundus were normal on                            retroflexion.                           The examined duodenum was normal. (examined under                            WL and NBI) Complications:            No immediate complications. Estimated Blood Loss:     Estimated blood loss: none. Impression:               - Normal larynx.                           - 1 cm hiatal  hernia.                           - Normal stomach.                           - Normal examined duodenum.                           - No specimens collected. Recommendation:           - Patient has a contact number available for  emergencies. The signs and symptoms of potential                            delayed complications were discussed with the                            patient. Return to normal activities tomorrow.                            Written discharge instructions were provided to the                            patient.                           - Resume previous diet.                           - Continue present medications.                           - Repeat upper endoscopy in 4 years for screening                            purposes. Porsche Noguchi L. Myrtie Neither, MD 07/25/2023 2:16:35 PM This report has been signed electronically.

## 2023-07-25 NOTE — Progress Notes (Signed)
History and Physical:  This patient presents for endoscopic testing for: Encounter Diagnosis  Name Primary?   Lynch syndrome Yes    37 yo man known to me here for endoscopic screening due to PMS-2 Lynch syndrome Denies any chronic upper or lower GI symptoms Overall excellent health  Patient is otherwise without complaints or active issues today.   Past Medical History: Past Medical History:  Diagnosis Date   Anxiety    Arthritis    Costocgondritis   Back pain    03/17/12 xray: minimal compression deformity L1. 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   Chickenpox    childhood    Costochondritis 05/21/2018   Family history of breast cancer    Family history of lung cancer    Family history of Lynch syndrome    Heart murmur    present since childhood, normal echo 2002   IBS (irritable bowel syndrome) 2017   bloody stool x1   Insomnia 2017   Psoriasis of scalp      Past Surgical History: Past Surgical History:  Procedure Laterality Date   COLONOSCOPY     WISDOM TOOTH EXTRACTION      Allergies: No Known Allergies  Outpatient Meds: Current Outpatient Medications  Medication Sig Dispense Refill   DULoxetine (CYMBALTA) 60 MG capsule TAKE 1 CAPSULE BY MOUTH EVERY DAY 90 capsule 3   ciclopirox (LOPROX) 0.77 % cream Apply 77 % topically 2 (two) times daily. (Patient not taking: Reported on 07/02/2023)     fluocinonide ointment (LIDEX) 0.05 % Apply 1 Application topically 2 (two) times daily as needed. (Patient not taking: Reported on 07/02/2023) 60 g 1   Current Facility-Administered Medications  Medication Dose Route Frequency Provider Last Rate Last Admin   0.9 %  sodium chloride infusion  500 mL Intravenous Continuous Nandigam, Kavitha V, MD          ___________________________________________________________________ Objective   Exam:  BP 130/83   Pulse 74   Temp 98.2 F (36.8 C) (Temporal)   Resp 17   Ht 6\' 4"  (1.93 m)   Wt 210 lb  (95.3 kg)   SpO2 99%   BMI 25.56 kg/m   CV: regular , S1/S2 Resp: clear to auscultation bilaterally, normal RR and effort noted GI: soft, no tenderness, with active bowel sounds.   Assessment: Encounter Diagnosis  Name Primary?   Lynch syndrome Yes     Plan: Colonoscopy EGD  The benefits and risks of the planned procedure were described in detail with the patient or (when appropriate) their health care proxy.  Risks were outlined as including, but not limited to, bleeding, infection, perforation, adverse medication reaction leading to cardiac or pulmonary decompensation, pancreatitis (if ERCP).  The limitation of incomplete mucosal visualization was also discussed.  No guarantees or warranties were given.  The patient is appropriate for an endoscopic procedure in the ambulatory setting.   - Amada Jupiter, MD

## 2023-07-25 NOTE — Progress Notes (Signed)
Pt's states no medical or surgical changes since previsit or office visit. 

## 2023-07-25 NOTE — Patient Instructions (Addendum)
- Resume previous diet. - Continue present medications. - Repeat upper endoscopy in 4 years for screening purposes - Repeat colonoscopy in 2 years for screening purposes. - See the other procedure note for documentation of additional recommendations.   YOU HAD AN ENDOSCOPIC PROCEDURE TODAY AT THE [facility]:   Refer to the procedure report that was given to you for any specific questions about what was found during the examination.  If the procedure report does not answer your questions, please call your gastroenterologist to clarify.  If you requested that your care partner not be given the details of your procedure findings, then the procedure report has been included in a sealed envelope for you to review at your convenience later.  YOU SHOULD EXPECT: Some feelings of bloating in the abdomen. Passage of more gas than usual.  Walking can help get rid of the air that was put into your GI tract during the procedure and reduce the bloating. If you had a lower endoscopy (such as a colonoscopy or flexible sigmoidoscopy) you may notice spotting of blood in your stool or on the toilet paper. If you underwent a bowel prep for your procedure, you may not have a normal bowel movement for a few days.  Please Note:  You might notice some irritation and congestion in your nose or some drainage.  This is from the oxygen used during your procedure.  There is no need for concern and it should clear up in a day or so.  SYMPTOMS TO REPORT IMMEDIATELY:  Following lower endoscopy (colonoscopy or flexible sigmoidoscopy):  Excessive amounts of blood in the stool  Significant tenderness or worsening of abdominal pains  Swelling of the abdomen that is new, acute  Fever of 100F or higher  Following upper endoscopy (EGD)  Vomiting of blood or coffee ground material  New chest pain or pain under the shoulder blades  Painful or persistently difficult swallowing  New shortness of breath  Fever of 100F  or higher  Black, tarry-looking stools  For urgent or emergent issues, a gastroenterologist can be reached at any hour by calling [phone]. Do not use MyChart messaging for urgent concerns.    DIET:  We do recommend a small meal at first, but then you may proceed to your regular diet.  Drink plenty of fluids but you should avoid alcoholic beverages for 24 hours.  ACTIVITY:  You should plan to take it easy for the rest of today and you should NOT DRIVE or use heavy machinery until tomorrow (because of the sedation medicines used during the test).    FOLLOW UP: Our staff will call the number listed on your records the next business day following your procedure.  We will call around 7:15- 8:00 am to check on you and address any questions or concerns that you may have regarding the information given to you following your procedure. If we do not reach you, we will leave a message.     If any biopsies were taken you will be contacted by phone or by letter within the next 1-3 weeks.  Please call us at [phone] if you have not heard about the biopsies in 3 weeks.    SIGNATURES/CONFIDENTIALITY: You and/or your care partner have signed paperwork which will be entered into your electronic medical record.  These signatures attest to the fact that that the information above on your After Visit Summary has been reviewed and is understood.  Full responsibility of the confidentiality of  this discharge information lies with you and/or your care-partner.

## 2023-07-25 NOTE — Op Note (Signed)
[facility] Patient Name: Paul [name] Procedure Date: 07/25/2023 1:32 PM MRN: [id number] Endoscopist: Sherilyn Cooter L. Myrtie Neither , MD, [id number] Age: 37 Referring MD:  Date of Birth: 1985/10/20 Gender: Male Account #: [id number] Procedure:                Colonoscopy Indications:              Lynch Syndrome                           no polyps August 2020 or November 2022 Medicines:                Monitored Anesthesia Care Procedure:                Pre-Anesthesia Assessment:                           - Prior to the procedure, a History and Physical                            was performed, and patient medications and                            allergies were reviewed. The patient's tolerance of                            previous anesthesia was also reviewed. The risks                            and benefits of the procedure and the sedation                            options and risks were discussed with the patient.                            All questions were answered, and informed consent                            was obtained. Prior Anticoagulants: The patient has                            taken no anticoagulant or antiplatelet agents. ASA                            Grade Assessment: II - A patient with mild systemic                            disease. After reviewing the risks and benefits,                            the patient was deemed in satisfactory condition to                            undergo the procedure.  After obtaining informed consent, the colonoscope                            was passed under direct vision. Throughout the                            procedure, the patient's blood pressure, pulse, and                            oxygen saturations were monitored continuously. The                            CF [id number] #[id number] was introduced through the anus                            and advanced to the the cecum, identified by                             appendiceal orifice and ileocecal valve. The                            colonoscopy was performed without difficulty. The                            patient tolerated the procedure well. The quality                            of the bowel preparation was excellent. The                            ileocecal valve, appendiceal orifice, and rectum                            were photographed. Scope In: 1:47:17 PM Scope Out: 1:59:07 PM Scope Withdrawal Time: 0 hours 8 minutes 17 seconds  Total Procedure Duration: 0 hours 11 minutes 50 seconds  Findings:                 The perianal and digital rectal examinations were                            normal.                           Repeat examination of right colon under NBI                            performed.                           The entire examined colon appeared normal on direct                            and retroflexion views. Complications:            No immediate complications. Estimated Blood Loss:  Estimated blood loss: none. Impression:               - The entire examined colon is normal on direct and                            retroflexion views.                           - No specimens collected. Recommendation:           - Patient has a contact number available for                            emergencies. The signs and symptoms of potential                            delayed complications were discussed with the                            patient. Return to normal activities tomorrow.                            Written discharge instructions were provided to the                            patient.                           - Resume previous diet.                           - Continue present medications.                           - Repeat colonoscopy in 2 years for screening                            purposes.                           - See the other procedure note for documentation of                             additional recommendations. Akeria Hedstrom L. Myrtie Neither, MD 07/25/2023 2:12:36 PM This report has been signed electronically.

## 2023-07-25 NOTE — Progress Notes (Signed)
To pacu, VSS. Report to [email or web address].tb 

## 2023-07-26 ENCOUNTER — Telehealth: Payer: Self-pay | Admitting: *Deleted

## 2023-07-26 NOTE — Telephone Encounter (Signed)
  Follow up Call-     07/25/2023   12:59 PM 07/14/2021    2:27 PM  Call back number  Post procedure Call Back phone  # [phone] #[phone] cell  Permission to leave phone message Yes Yes     Patient questions:  Do you have a fever, pain , or abdominal swelling? No. Pain Score  0 *  Have you tolerated food without any problems? Yes.    Have you been able to return to your normal activities? Yes.    Do you have any questions about your discharge instructions: Diet   No. Medications  Yes.   Follow up visit  No.  Do you have questions or concerns about your Care? No.  Actions: * If pain score is 4 or above: No action needed, pain <4.

## 2024-05-09 ENCOUNTER — Other Ambulatory Visit: Payer: Self-pay | Admitting: Family Medicine

## 2024-05-09 NOTE — Telephone Encounter (Signed)
 Pt has not been seen since 2022. Refill inappropriate.

## 2024-05-14 ENCOUNTER — Other Ambulatory Visit: Payer: Self-pay | Admitting: Family Medicine

## 2024-05-14 ENCOUNTER — Encounter: Payer: Self-pay | Admitting: Family Medicine

## 2024-05-14 NOTE — Telephone Encounter (Signed)
 Copied from CRM #[id number]. Topic: Clinical - Medication Refill >> May 14, 2024 11:38 AM Drema MATSU wrote: Medication: DULoxetine  (CYMBALTA ) 60 MG capsule  Has the patient contacted their pharmacy? Yes (Agent: If no, request that the patient contact the pharmacy for the refill. If patient does not wish to contact the pharmacy document the reason why and proceed with request.) was not renewed by provider/ denied (Agent: If yes, when and what did the pharmacy advise?)  This is the patient's preferred pharmacy:  [id number] - [place], [place] - [address] [place] [address] Phone: [phone] Fax: [phone]  Is this the correct pharmacy for this prescription? Yes If no, delete pharmacy and type the correct one.   Has the prescription been filled recently? Yes  Is the patient out of the medication? Yes  Has the patient been seen for an appointment in the last year OR does the patient have an upcoming appointment? Yes  Can we respond through MyChart? Yes  Agent: Please be advised that Rx refills may take up to 3 business days. We ask that you follow-up with your pharmacy.

## 2024-05-16 MED ORDER — DULOXETINE HCL 60 MG PO CPEP: 60.0000 mg | ORAL_CAPSULE | Freq: Every day | ORAL | 0 refills | Status: DC

## 2024-05-16 NOTE — Telephone Encounter (Signed)
 Patient called in regards to this refill denial. He would like to taper off of this medication and asked if it could be refill for him to do so without an appointment?  Please advise.

## 2024-05-16 NOTE — Telephone Encounter (Addendum)
 Pt has not been seen since 01/28/2021, needs OV, please reach out to assist with scheduling.   Dr. Joane,  Forwarding to you to review and advise regarding refill and desire to taper off  Spoke with Dr. Joane, OK to send in 30 day supply but needs OV

## 2024-05-16 NOTE — Addendum Note (Signed)
 Addended by: MARDY LEOTIS RAMAN on: 05/16/2024 12:25 PM   Modules accepted: Orders

## 2024-05-27 ENCOUNTER — Encounter: Payer: BLUE CROSS/BLUE SHIELD | Admitting: Family Medicine

## 2024-06-07 ENCOUNTER — Other Ambulatory Visit: Payer: Self-pay | Admitting: Family Medicine

## 2024-06-09 NOTE — Telephone Encounter (Signed)
 Last OV 01/28/21  Pt has not been seen in > 1 year, needs OV.

## 2024-06-11 ENCOUNTER — Other Ambulatory Visit: Payer: Self-pay | Admitting: Family Medicine

## 2024-06-11 ENCOUNTER — Ambulatory Visit: Admitting: Family Medicine

## 2024-06-11 NOTE — Telephone Encounter (Signed)
 Last OV 01/28/21 Next OV not scheduled.   Pt needs OV, has not been seen in > 1 year.

## 2024-06-13 ENCOUNTER — Ambulatory Visit: Admitting: Family Medicine

## 2024-06-13 ENCOUNTER — Encounter: Payer: Self-pay | Admitting: Family Medicine

## 2024-06-13 VITALS — BP 122/82 | HR 65 | Temp 98.2°F | Wt 214.0 lb

## 2024-06-13 DIAGNOSIS — Z23 Encounter for immunization: Secondary | ICD-10-CM

## 2024-06-13 DIAGNOSIS — R0789 Other chest pain: Secondary | ICD-10-CM | POA: Diagnosis not present

## 2024-06-13 DIAGNOSIS — M797 Fibromyalgia: Secondary | ICD-10-CM | POA: Diagnosis not present

## 2024-06-13 MED ORDER — DULOXETINE HCL 60 MG PO CPEP: 60.0000 mg | ORAL_CAPSULE | Freq: Every day | ORAL | 1 refills | Status: AC

## 2024-06-13 NOTE — Progress Notes (Signed)
 Paul [name] , 1985-09-14, 38 y.o., male MRN: [id number] Patient Care Team    Relationship Specialty Notifications Start End  Catherine Charlies LABOR, DO PCP - General Family Medicine  05/04/15   Legrand Victory LITTIE DOUGLAS, MD Consulting Physician Gastroenterology  05/25/23   Joane Artist RAMAN, MD Consulting Physician Family Medicine  05/25/23   Dolphus Reiter, MD Consulting Physician Rheumatology  05/25/23     Chief Complaint  Patient presents with   Medication question    Pt wanting to discuss med take over of duloxetine .  Influenza vaccine- given     Subjective: Paul [name] is a 9 y.o. Pt presents for an OV to discuss his Cymbalta  60 mg daily prescription.  He and be receiving this medication through sports medicine for his chronic sternal pain.  He reports that he felt maybe he had some fibromyalgia component and tried him on Cymbalta .  He feels medication seems to be working and it does help him he noticed with some anxiety situations as well. He would like to stay on this medication, and received through this office.     06/13/2024    8:36 AM 05/25/2023    8:35 AM 05/16/2023   12:44 PM 08/22/2022    4:08 PM 11/23/2021    3:08 PM  Depression screen PHQ 2/9  Decreased Interest 0 0 0 0 0  Down, Depressed, Hopeless 0 0 0 0 0  PHQ - 2 Score 0 0 0 0 0  Altered sleeping  0 0    Tired, decreased energy  0 0    Change in appetite  0 0    Feeling bad or failure about yourself   0 0    Trouble concentrating  0 0    Moving slowly or fidgety/restless  0 0    Suicidal thoughts  0 0    PHQ-9 Score  0 0    Difficult doing work/chores  Not difficult at all Not difficult at all      No Known Allergies Social History   Social History Narrative   Originally from  [place] . Married to Paul [name].    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 1 dog.    Wears his seatbelt. Wears a bike helmet.    Exercises more than 3 times a week.    Has a smoke alarm the home.    Has firearms  in the home.   Past Medical History:  Diagnosis Date   Anxiety    Arthritis    Costocgondritis   Back pain    03/17/12 xray: minimal compression deformity L1. 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   Chickenpox    childhood    Costochondritis 05/21/2018   Family history of breast cancer    Family history of lung cancer    Family history of Lynch syndrome    Heart murmur    present since childhood, normal echo 2002   IBS (irritable bowel syndrome) 2017   bloody stool x1   Insomnia 2017   Psoriasis of scalp    Past Surgical History:  Procedure Laterality Date   COLONOSCOPY     WISDOM TOOTH EXTRACTION     Family History  Problem Relation Age of Onset   Alcohol abuse Mother    Other Mother        Lynch syndrome- PMS2 nutation    Lung cancer Father 97       nonsmoker   Other Sister  Lynch syndrome    Healthy Brother    Breast cancer Maternal Grandmother 46   Endometrial cancer Maternal Great-grandmother 30   Ovarian cancer Maternal Great-grandmother 55   Cancer Other 54       urothelial cancer   Colon cancer Neg Hx    Prostate cancer Neg Hx    Pancreatic cancer Neg Hx    Gastric cancer Neg Hx    Stomach cancer Neg Hx    Esophageal cancer Neg Hx    Colon polyps Neg Hx    Rectal cancer Neg Hx    Allergies as of 06/13/2024   No Known Allergies      Medication List        Accurate as of June 13, 2024 10:57 AM. If you have any questions, ask your nurse or doctor.          ciclopirox  0.77 % cream Commonly known as: LOPROX  Apply 77 % topically 2 (two) times daily.   DULoxetine  60 MG capsule Commonly known as: CYMBALTA  Take 1 capsule (60 mg total) by mouth daily.   fluocinonide  ointment 0.05 % Commonly known as: LIDEX  Apply 1 Application topically 2 (two) times daily as needed.        All past medical history, surgical history, allergies, family history, immunizations andmedications were updated in the EMR today and  reviewed under the history and medication portions of their EMR.     Review of Systems  All other systems reviewed and are negative.  Negative, with the exception of above mentioned in HPI   Objective:  BP 122/82   Pulse 65   Temp 98.2 F (36.8 C)   Wt 214 lb (97.1 kg)   SpO2 97%   BMI 26.05 kg/m  Body mass index is 26.05 kg/m.  Physical Exam Vitals and nursing note reviewed. Exam conducted with a chaperone present.  Constitutional:      General: He is not in acute distress.    Appearance: Normal appearance. He is not ill-appearing, toxic-appearing or diaphoretic.  HENT:     Head: Normocephalic and atraumatic.  Eyes:     General: No scleral icterus.       Right eye: No discharge.        Left eye: No discharge.     Extraocular Movements: Extraocular movements intact.     Pupils: Pupils are equal, round, and reactive to light.  Skin:    General: Skin is warm and dry.     Coloration: Skin is not jaundiced or pale.     Findings: No rash.  Neurological:     Mental Status: He is alert and oriented to person, place, and time. Mental status is at baseline.  Psychiatric:        Mood and Affect: Mood normal.        Behavior: Behavior normal.        Thought Content: Thought content normal.        Judgment: Judgment normal.     No results found. No results found. No results found for this or any previous visit (from the past 24 hours).  Assessment/Plan: [name] is a 38 y.o. male present for OV for  Sternal pain (Primary)/fibromyalgia Patient has suffered from sternal pain for quite a few years, and underwent many treatments. He has felt Cymbalta  60 mg has helped his symptoms.  He does endorse it also helps him in some anxiety in situations. Continue Cymbalta  60 mg daily  Influenza vaccine administered today  Reviewed expectations  re: course of current medical issues. Discussed self-management of symptoms. Outlined signs and symptoms indicating need for more  acute intervention. Patient verbalized understanding and all questions were answered. Patient received an After-Visit Summary.   Return in about 25 weeks (around 12/05/2024) for cpe (20 min), Routine chronic condition follow-up.  No orders of the defined types were placed in this encounter.  Meds ordered this encounter  Medications   DULoxetine  (CYMBALTA ) 60 MG capsule    Sig: Take 1 capsule (60 mg total) by mouth daily.    Dispense:  90 capsule    Refill:  1   Referral Orders  No referral(s) requested today     Note is dictated utilizing voice recognition software. Although note has been proof read prior to signing, occasional typographical errors still can be missed. If any questions arise, please do not hesitate to call for verification.   electronically signed by:  Charlies Bellini, DO  [facility] [place]

## 2024-06-13 NOTE — Patient Instructions (Addendum)

## 2024-07-14 ENCOUNTER — Encounter: Payer: Self-pay | Admitting: Family Medicine

## 2024-07-14 NOTE — Addendum Note (Signed)
 Addended by: GEORGEAN BEEN A on: 07/14/2024 02:23 PM   Modules accepted: Orders

## 2024-12-09 ENCOUNTER — Encounter: Admitting: Family Medicine
